# Patient Record
Sex: Male | Born: 1965 | Race: Black or African American | Hispanic: No | Marital: Married | Smoking: Current every day smoker
Health system: Southern US, Community
[De-identification: ages and names within clinical notes are randomized; demographics above are authoritative.]

---

## 2006-02-25 ENCOUNTER — Emergency Department (HOSPITAL_COMMUNITY): Admission: EM | Admit: 2006-02-25 | Discharge: 2006-02-26 | Payer: Self-pay | Admitting: Emergency Medicine

## 2007-01-16 IMAGING — CR DG SHOULDER 2+V*L*
3 series · 3 of 3 positions shown · non-contrast
Comparison: none

CLINICAL DATA: 42 year-old, left shoulder pain. Patient fell.
 LEFT HUMERUS ? 2 VIEW:

[w shoulder ap internal left]
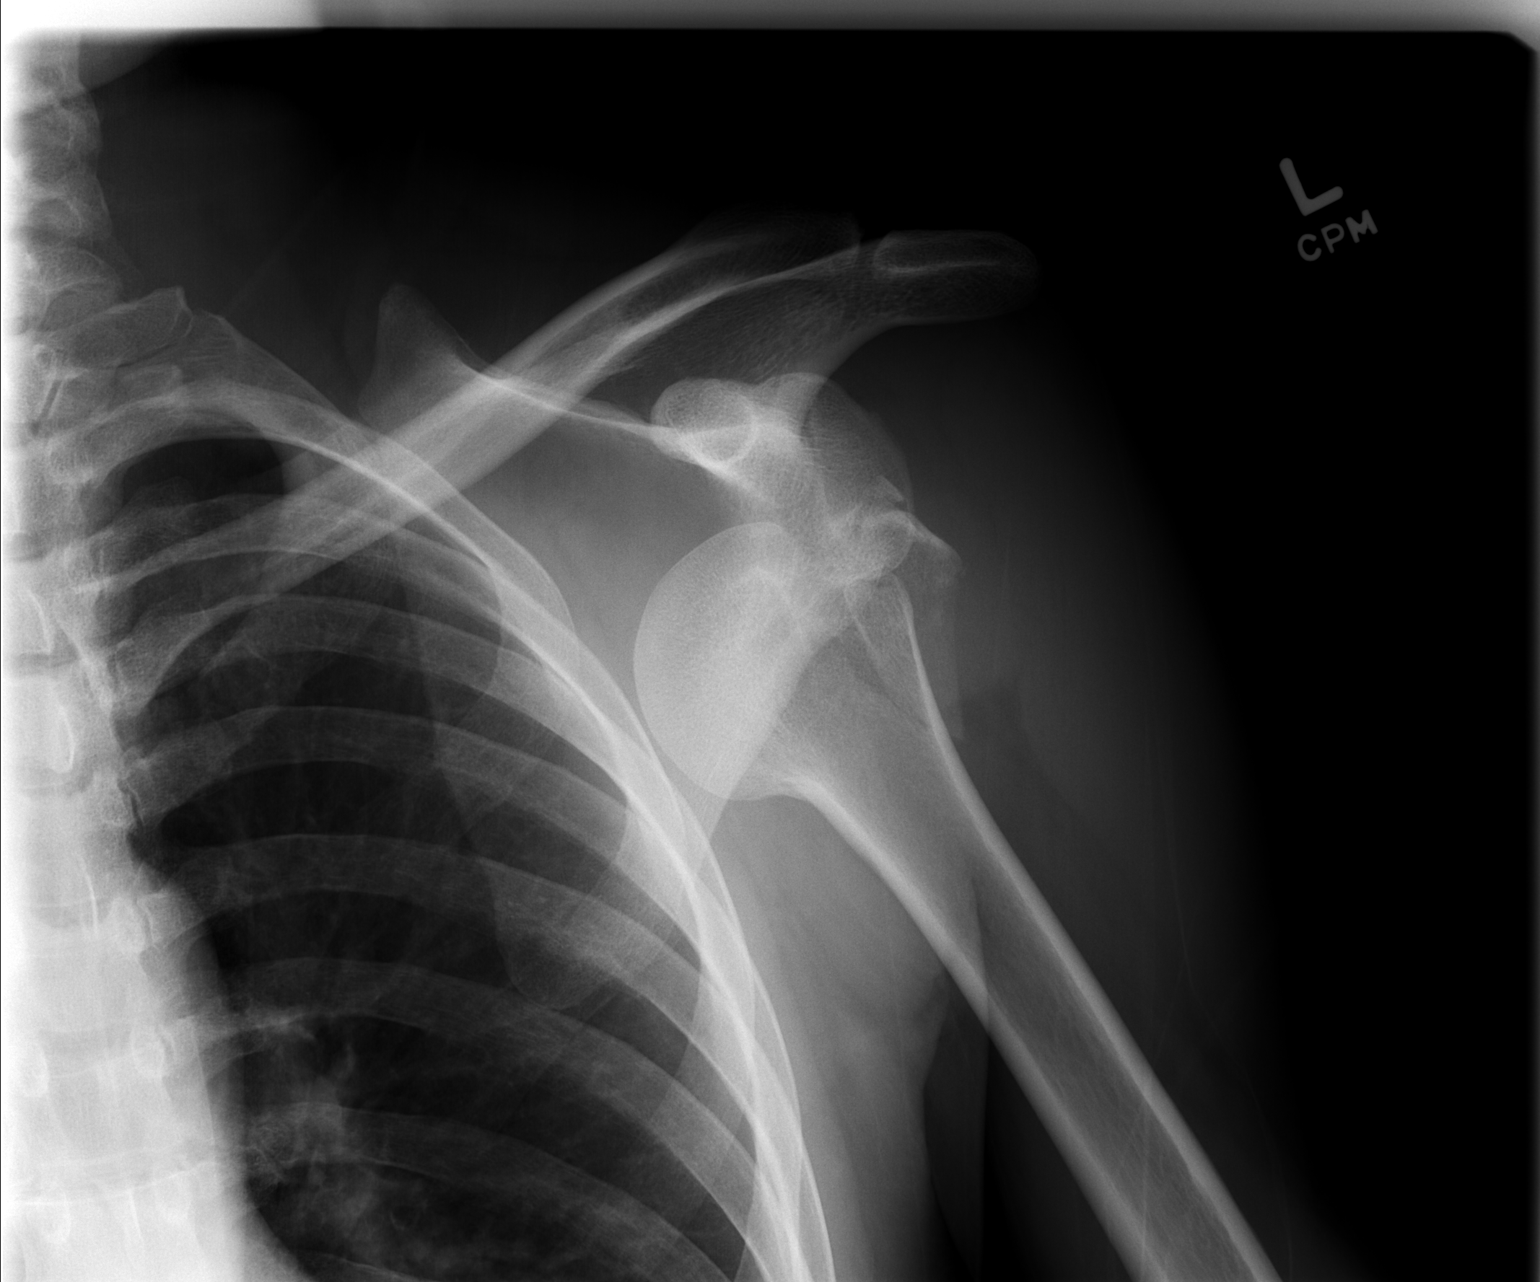

[w shoulder ap external left]
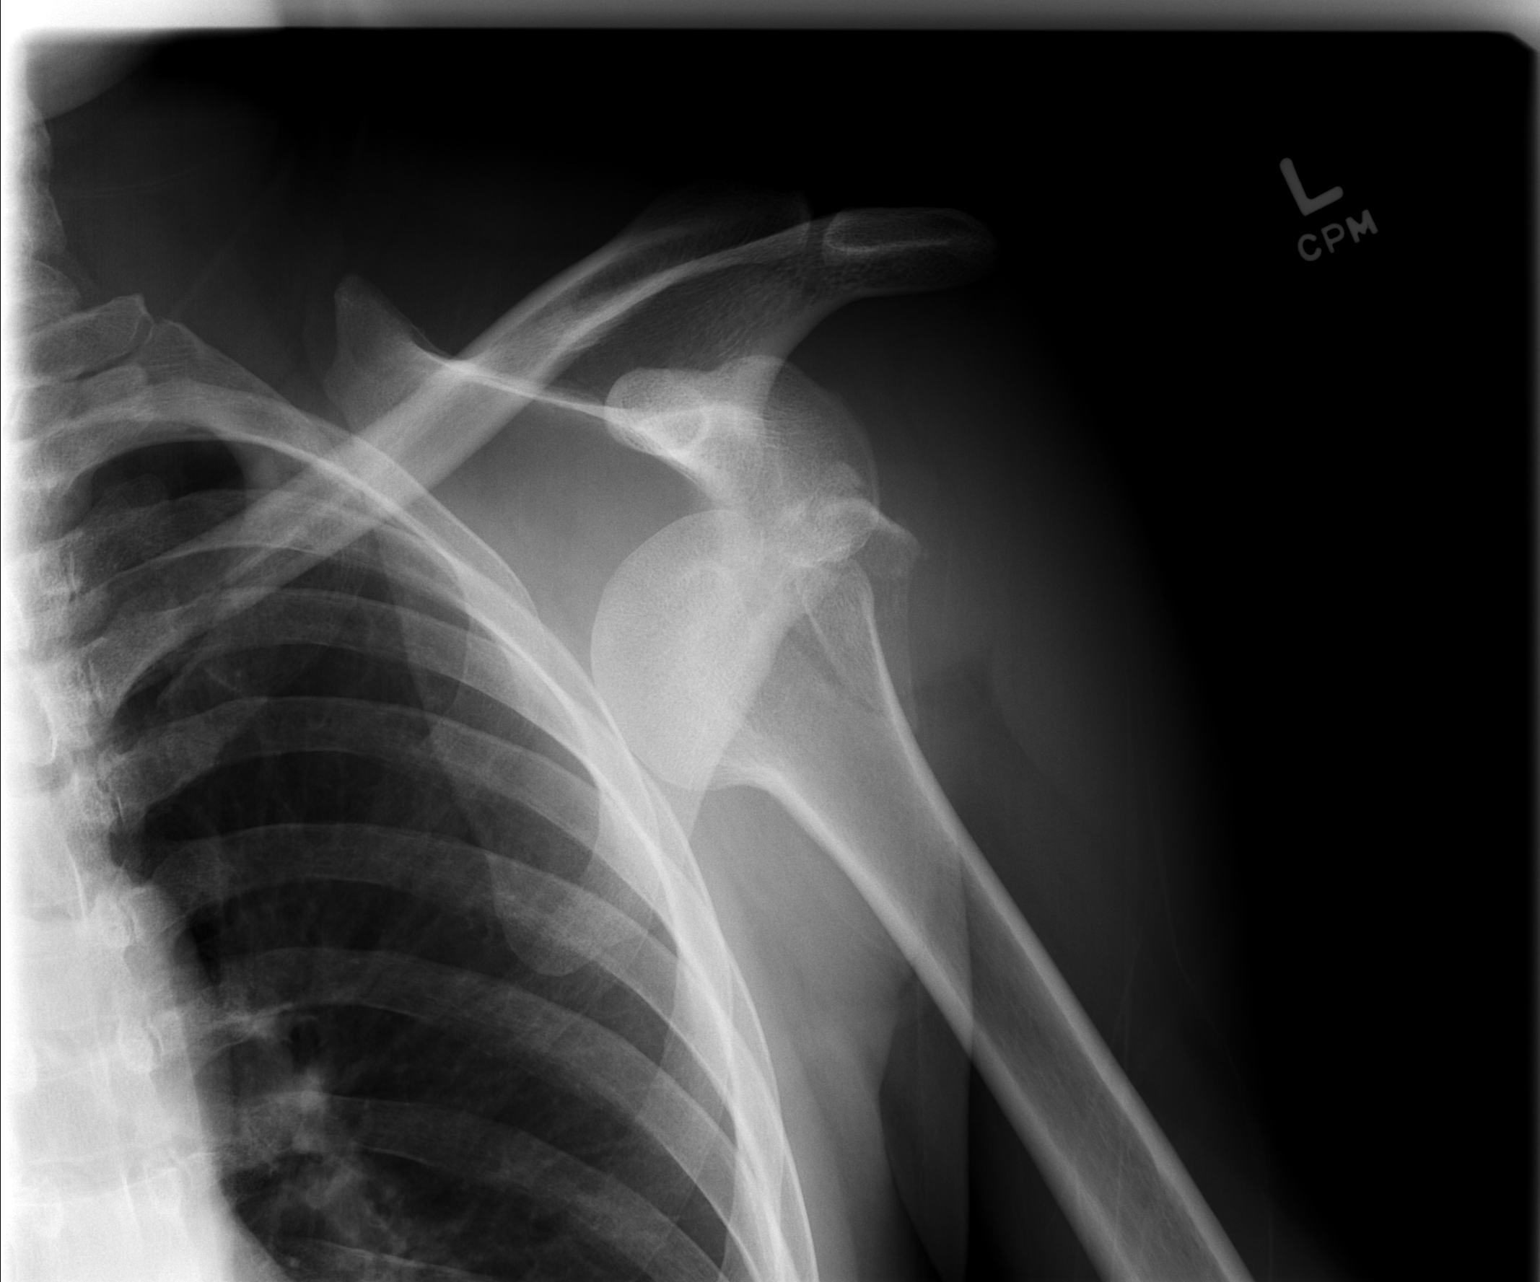

[w shoulder y view left]
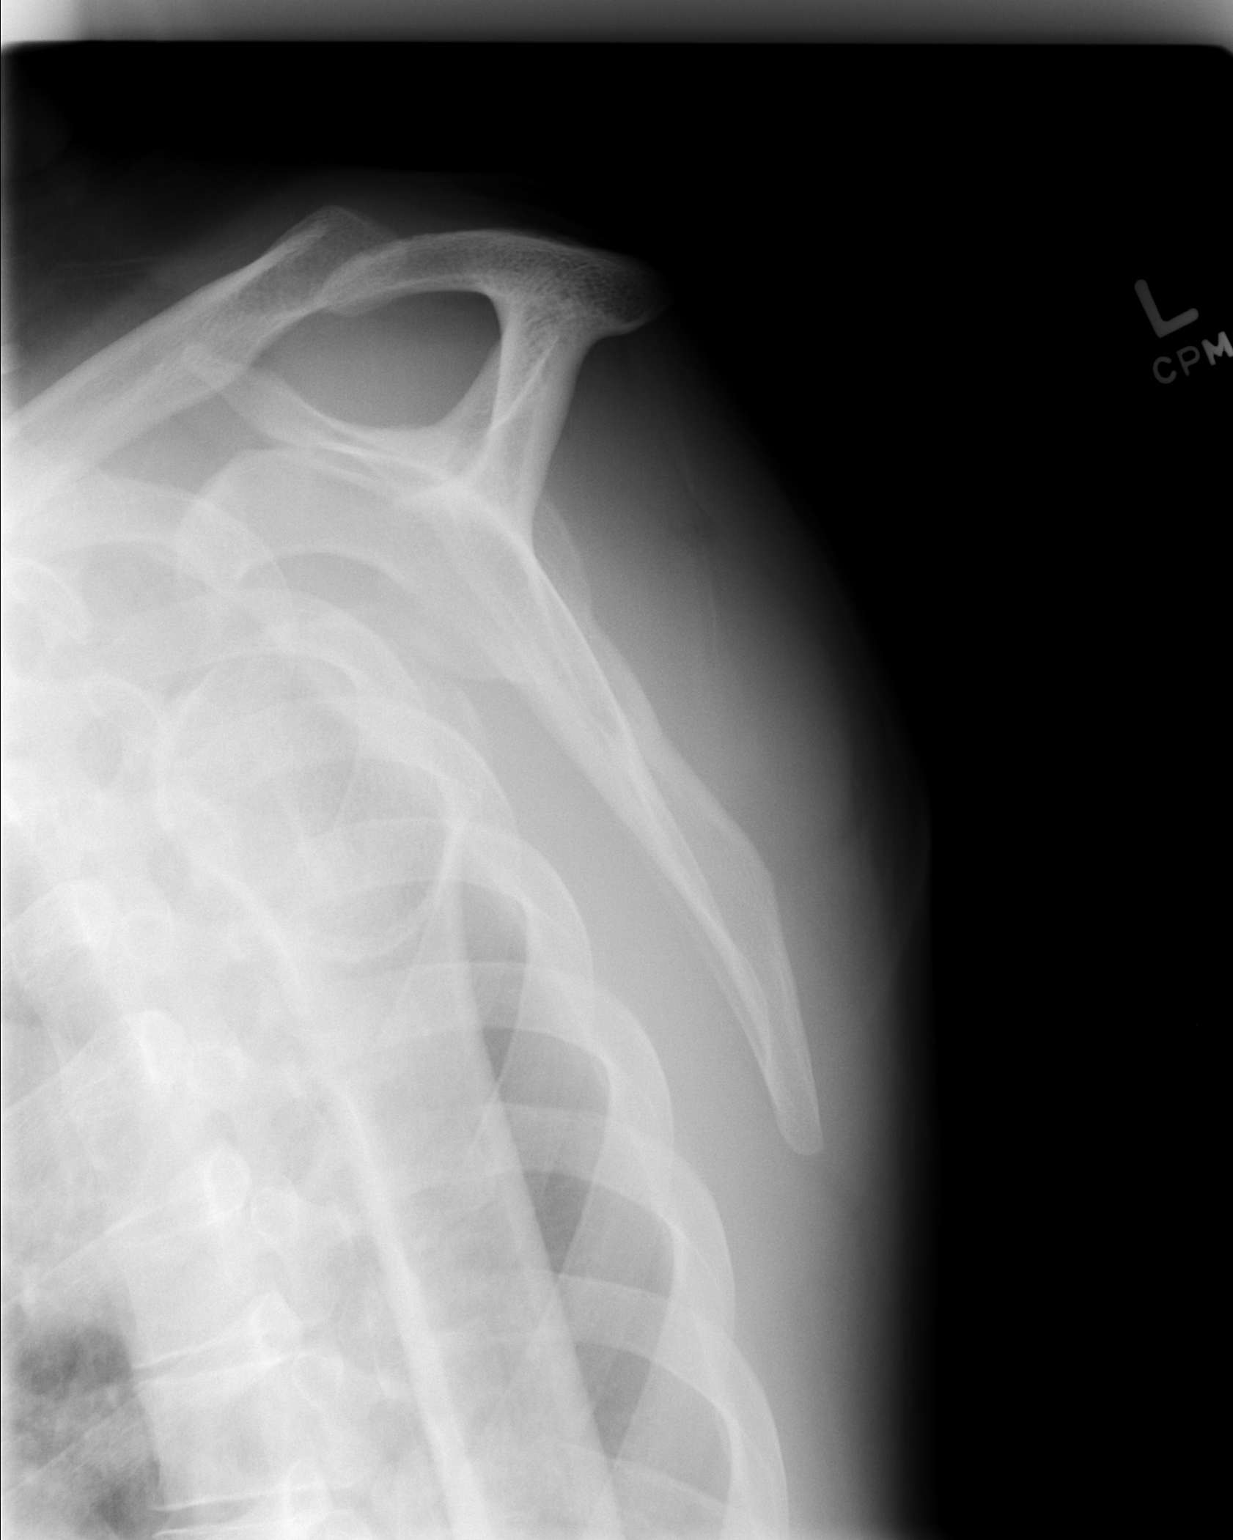

[3 of 3 positions shown; findings below may reference images not displayed]

FINDINGS: There is a fracture dislocation involving the humeral head.  No humeral shaft fractures are seen.
IMPRESSION: 1.  Fracture dislocation of the humeral head.
 2.  No humeral shaft fractures.
 LEFT SHOULDER ? 3 VIEW:
FINDINGS: Anterior dislocation of the humeral head.  There is a displaced fracture of the greater tuberosity.  No definite glenoid fracture is seen. AC joint is intact.  Left apex is clear.  Clavicle is intact.
IMPRESSION: Anterior dislocation with displaced greater tuberosity fracture.

## 2010-05-26 ENCOUNTER — Emergency Department (HOSPITAL_COMMUNITY): Admission: EM | Admit: 2010-05-26 | Discharge: 2010-05-26 | Payer: Self-pay | Admitting: Emergency Medicine

## 2012-05-05 ENCOUNTER — Encounter (HOSPITAL_COMMUNITY): Payer: Self-pay

## 2012-05-05 ENCOUNTER — Emergency Department (HOSPITAL_COMMUNITY)
Admission: EM | Admit: 2012-05-05 | Discharge: 2012-05-05 | Disposition: A | Discharge status: Home / Self Care | Payer: Self-pay | Attending: Emergency Medicine | Admitting: Emergency Medicine

## 2012-05-05 DIAGNOSIS — S91309A Unspecified open wound, unspecified foot, initial encounter: Secondary | ICD-10-CM | POA: Insufficient documentation

## 2012-05-05 DIAGNOSIS — W268XXA Contact with other sharp object(s), not elsewhere classified, initial encounter: Secondary | ICD-10-CM | POA: Insufficient documentation

## 2012-05-05 DIAGNOSIS — S91339A Puncture wound without foreign body, unspecified foot, initial encounter: Secondary | ICD-10-CM

## 2012-05-05 DIAGNOSIS — Y9301 Activity, walking, marching and hiking: Secondary | ICD-10-CM | POA: Insufficient documentation

## 2012-05-05 DIAGNOSIS — F17200 Nicotine dependence, unspecified, uncomplicated: Secondary | ICD-10-CM | POA: Insufficient documentation

## 2012-05-05 DIAGNOSIS — Y998 Other external cause status: Secondary | ICD-10-CM | POA: Insufficient documentation

## 2012-05-05 MED ORDER — CIPROFLOXACIN HCL 750 MG PO TABS: 750.0000 mg | ORAL_TABLET | Freq: Two times a day (BID) | ORAL | Status: AC

## 2012-05-05 MED ORDER — TETANUS-DIPHTH-ACELL PERTUSSIS 5-2.5-18.5 LF-MCG/0.5 IM SUSP
0.5000 mL | Freq: Once | INTRAMUSCULAR | Status: AC
  Administered 2012-05-05: 0.5 mL via INTRAMUSCULAR
  Filled 2012-05-05: qty 0.5

## 2012-05-05 NOTE — ED Provider Notes (Signed)
History     CSN: [id number]  Arrival date & time 05/05/12  0807   First MD Initiated Contact with Patient 05/05/12 0813      Chief Complaint  Patient presents with  . Puncture Wound    (Consider location/radiation/quality/duration/timing/severity/associated sxs/prior treatment) HPI Pt stepped on a piece of wire 2 days ago.  Punctured through his tennis shoe sole and into sole of left foot.  Pt has cleaned the area with hydrogen peroxide.  No fever, no pain, no redness or drainage around wound.  Pt does not know when his last tetanus shot was.  Pt states the piece of wire was thick and did not break off when he stepped on it.  There are no other associated systemic symptoms, there are no alleviating or modifying factors.   History reviewed. No pertinent past medical history.  History reviewed. No pertinent past surgical history.  No family history on file.  History  Substance Use Topics  . Smoking status: Current Everyday Smoker  . Smokeless tobacco: Not on file  . Alcohol Use: Yes      Review of Systems ROS reviewed and all otherwise negative except for mentioned in HPI  Allergies  Review of patient's allergies indicates no known allergies.  Home Medications   Current Outpatient Rx  Name Route Sig Dispense Refill  . CIPROFLOXACIN HCL 750 MG PO TABS Oral Take 1 tablet (750 mg total) by mouth 2 (two) times daily. 14 tablet 0    BP 146/81  Pulse 73  Temp 97.8 F (36.6 C) (Oral)  Resp 16  Ht 5\' 9"  (1.753 m)  Wt 145 lb (65.772 kg)  BMI 21.41 kg/m2  SpO2 100% Vitals reviewed Physical Exam Physical Examination: General appearance - alert, well appearing, and in no distress Mental status - alert, oriented to person, place, and time Chest - clear to auscultation, no wheezes, rales or rhonchi, symmetric air entry Heart - normal rate, regular rhythm, normal S1, S2, no murmurs, rubs, clicks or gallops Neurological - alert, oriented, normal speech, no focal findings or  movement disorder noted Musculoskeletal - no joint tenderness, deformity or swelling Extremities - peripheral pulses normal, no pedal edema, no clubbing or cyanosis Skin - normal coloration and turgor, no rashes,very small punctate region of hyperpigmented skin on plantar surface of left foot- no tenderness, no erythema surrounding  ED Course  Procedures (including critical care time)  Labs Reviewed - No data to display No results found.   1. Puncture wound of foot       MDM  Pt presents with c/o puncture wound through sole of shoe.  Tetanus updated, pt also started on cipro for possible pseudomonas coverage.  Pt discharged with strict return precautions, he is agreeable with this plan.        Ethelda Chick, MD 05/08/12 [phone]

## 2012-05-05 NOTE — Discharge Instructions (Signed)
Return to the ED with any concerns including fever, drainage from wounds, redness around wound, or any other alarming symptoms

## 2012-05-05 NOTE — ED Notes (Signed)
Pt. Stepped on a wire 2 days ago and it punctured the bottom of his rt. Foot.  No swelling or redness noted. No drainage noted.

## 2012-10-04 ENCOUNTER — Emergency Department (HOSPITAL_COMMUNITY)
Admission: EM | Admit: 2012-10-04 | Discharge: 2012-10-04 | Disposition: A | Discharge status: Home / Self Care | Payer: Self-pay | Attending: Emergency Medicine | Admitting: Emergency Medicine

## 2012-10-04 ENCOUNTER — Encounter (HOSPITAL_COMMUNITY): Payer: Self-pay | Admitting: Emergency Medicine

## 2012-10-04 DIAGNOSIS — T5894XA Toxic effect of carbon monoxide from unspecified source, undetermined, initial encounter: Secondary | ICD-10-CM | POA: Insufficient documentation

## 2012-10-04 DIAGNOSIS — R51 Headache: Secondary | ICD-10-CM | POA: Insufficient documentation

## 2012-10-04 DIAGNOSIS — F17200 Nicotine dependence, unspecified, uncomplicated: Secondary | ICD-10-CM | POA: Insufficient documentation

## 2012-10-04 DIAGNOSIS — T5891XA Toxic effect of carbon monoxide from unspecified source, accidental (unintentional), initial encounter: Secondary | ICD-10-CM

## 2012-10-04 DIAGNOSIS — R110 Nausea: Secondary | ICD-10-CM | POA: Insufficient documentation

## 2012-10-04 LAB — POCT I-STAT 3, ART BLOOD GAS (G3+)
Acid-Base Excess: 3 mmol/L — ABNORMAL HIGH (ref 0.0–2.0)
Bicarbonate: 28.3 mEq/L — ABNORMAL HIGH (ref 20.0–24.0)
O2 Saturation: 100 %
TCO2: 30 mmol/L (ref 0–100)

## 2012-10-04 LAB — CARBOXYHEMOGLOBIN
Carboxyhemoglobin: 7.1 % (ref 0.5–1.5)
Methemoglobin: 1 % (ref 0.0–1.5)

## 2012-10-04 LAB — POCT I-STAT, CHEM 8
BUN: 11 mg/dL (ref 6–23)
Calcium, Ion: 1.17 mmol/L (ref 1.12–1.23)
Chloride: 102 mEq/L (ref 96–112)
Creatinine, Ser: 0.9 mg/dL (ref 0.50–1.35)
Potassium: 3.8 mEq/L (ref 3.5–5.1)

## 2012-10-04 NOTE — ED Provider Notes (Addendum)
8:39 AM Assumed care from Dr Rhunette Croft in sign out. Pt assessed. Remains on NRB. Says he feels fine. Understands plan to continue until about 1000 and reassess. No new questions. Apparently FD to assess home.  Raeford Razor, MD 10/04/12 0841  10:11 AM Pt reassessed. No complaints. Reports that home was checked out and per him everything ok. Return precautions discussed. Outpt FU.  Raeford Razor, MD 10/04/12 1012

## 2012-10-04 NOTE — ED Notes (Signed)
Pt. Presents to the ED with dizziness with no nausea/vomiting at this present time.  Pt. States he was eating this evening and suddenly felt nauseated and dizzy.  Denies pain and nausea at this time.  CMS intact.  Pt. Has consumed ETOH this evening around 10:30 pm stated by patient.  Pt. Appear stable at this time.

## 2012-10-04 NOTE — ED Notes (Signed)
Pt remains on NRB until 10am per poison control and EDP recommendations

## 2012-10-04 NOTE — ED Notes (Signed)
Pt c/o dizziness this evening around 2 am after drinking a cup of coffee.  States he normally drinks alcohol and smokes daily.  Has been using Kerosene heater the past 2 weeks.  No pain, diaphoresis, SOB.  C/o nausea.

## 2012-10-04 NOTE — ED Notes (Signed)
[employer] informed of mother still in home. Pt unable to get hold of mother or sister. [employer] will visit home for welfare check.

## 2012-10-04 NOTE — ED Provider Notes (Signed)
History     CSN: [id number]  Arrival date & time 10/04/12  0423   First MD Initiated Contact with Patient 10/04/12 0434      Chief Complaint  Patient presents with  . Dizziness   HPI  History provided by the patient. Patient is a 46 year old male with no significant PMH who presents with complaints of lightheadedness, near syncope and nausea symptoms. Patient states he is at home tonight and early this morning relaxing when he suddenly began to feel lightheaded and dizzy with nausea symptoms. Patient does report having a 40 ounce beer earlier in the evening but this is typical for him and he is a daily drinker of about the same amount. Patient does also report having some marijuana in the evening but this also is normal for him. Patient was having some cups of coffee around 2 AM this morning prior to the onset of his symptoms. Symptoms came on gradually and were persistent. Patient reports having very slight headache but no significant headache pain or discomfort. He tried to go outside for fresh air and sat on his patio but only had slight improvements. Patient states he felt very unusual and was concerned for something else causing his symptoms. Patient denies having any episode of vomiting. He denies having any fever, chills or sweats. He has been well otherwise recently. No nasal congestion, rhinorrhea or sore throat. No abdominal pain or diarrhea or constipation symptoms. Patient has not traveled recently. He has no unusual foods. Patient does report having increased use of indoor kerosene heating system for his house due to the cold weather. Patient was using this this evening.     History reviewed. No pertinent past medical history.  History reviewed. No pertinent past surgical history.  History reviewed. No pertinent family history.  History  Substance Use Topics  . Smoking status: Current Every Day Smoker -- 1.0 packs/day for 10 years    Types: Cigarettes  . Smokeless tobacco:  Not on file  . Alcohol Use: Yes      Review of Systems  Constitutional: Negative for fever, chills and diaphoresis.  HENT: Negative for congestion, sore throat and rhinorrhea.   Respiratory: Negative for cough, shortness of breath and wheezing.   Cardiovascular: Negative for chest pain and palpitations.  Gastrointestinal: Positive for nausea. Negative for vomiting, abdominal pain, diarrhea and constipation.  Skin: Negative for rash.  Neurological: Positive for dizziness, light-headedness and headaches.  Psychiatric/Behavioral: Negative for confusion.  All other systems reviewed and are negative.    Allergies  Review of patient's allergies indicates no known allergies.  Home Medications  No current outpatient prescriptions on file.  BP 151/97  Pulse 65  Temp 97.7 F (36.5 C)  Resp 20  SpO2 97%  Physical Exam  Nursing note and vitals reviewed. Constitutional: He is oriented to person, place, and time. He appears well-developed and well-nourished. No distress.  HENT:  Head: Normocephalic.  Mouth/Throat: Oropharynx is clear and moist.  Neck: Normal range of motion. Neck supple.  Cardiovascular: Normal rate and regular rhythm.   No murmur heard. Pulmonary/Chest: Effort normal and breath sounds normal. No stridor. No respiratory distress. He has no wheezes. He has no rales.  Abdominal: Soft. There is no tenderness. There is no rebound and no guarding.  Neurological: He is alert and oriented to person, place, and time.  Skin: Skin is warm. No rash noted. He is not diaphoretic.  Psychiatric: He has a normal mood and affect. His behavior is normal.  ED Course  Procedures   Results for orders placed during the hospital encounter of 10/04/12  CARBOXYHEMOGLOBIN      Component Value Range   Total hemoglobin 16.1  13.5 - 18.0 g/dL   O2 Saturation 47.8     Carboxyhemoglobin 7.1 (*) 0.5 - 1.5 %   Methemoglobin 1.0  0.0 - 1.5 %  POCT I-STAT, CHEM 8      Component Value  Range   Sodium 140  135 - 145 mEq/L   Potassium 3.8  3.5 - 5.1 mEq/L   Chloride 102  96 - 112 mEq/L   BUN 11  6 - 23 mg/dL   Creatinine, Ser 2.95  0.50 - 1.35 mg/dL   Glucose, Bld 97  70 - 99 mg/dL   Calcium, Ion 6.21  3.08 - 1.23 mmol/L   TCO2 28  0 - 100 mmol/L   Hemoglobin 16.7  13.0 - 17.0 g/dL   HCT 65.7  84.6 - 96.2 %        1. Carbon monoxide poisoning       MDM  4:35AM patient seen and evaluated. Patient appears well in no acute distress. He has normal respirations at this time with normal O2 sats.  Pt with eleveated Carboxyhemoglobin with concerns for CO poisoning.  Pt started on non-rebreather.   Pt discussed Attending Physician. He will continue to follow pt and ABG results.    Pt does report turning stove off before leaving.  His mother is still at home.  He did try calling mother with no answer.  We will continue to try and reach her.        Angus Seller, [place] 10/04/12 [phone]

## 2012-10-04 NOTE — ED Notes (Signed)
Pt placed on non-rebreather per EDP Nanavati order

## 2012-10-07 NOTE — ED Provider Notes (Signed)
Medical screening examination/treatment/procedure(s) were performed by non-physician practitioner and as supervising physician I was immediately available for consultation/collaboration.  Derwood Kaplan, MD 10/07/12 0104

## 2015-09-14 ENCOUNTER — Encounter (HOSPITAL_COMMUNITY): Payer: Self-pay

## 2015-09-14 ENCOUNTER — Emergency Department (HOSPITAL_COMMUNITY)
Admission: EM | Admit: 2015-09-14 | Discharge: 2015-09-14 | Disposition: A | Discharge status: Home / Self Care | Payer: No Typology Code available for payment source | Attending: Emergency Medicine | Admitting: Emergency Medicine

## 2015-09-14 DIAGNOSIS — S299XXA Unspecified injury of thorax, initial encounter: Secondary | ICD-10-CM | POA: Diagnosis not present

## 2015-09-14 DIAGNOSIS — S199XXA Unspecified injury of neck, initial encounter: Secondary | ICD-10-CM | POA: Diagnosis not present

## 2015-09-14 DIAGNOSIS — F17210 Nicotine dependence, cigarettes, uncomplicated: Secondary | ICD-10-CM | POA: Insufficient documentation

## 2015-09-14 DIAGNOSIS — Y9389 Activity, other specified: Secondary | ICD-10-CM | POA: Diagnosis not present

## 2015-09-14 DIAGNOSIS — Y92410 Unspecified street and highway as the place of occurrence of the external cause: Secondary | ICD-10-CM | POA: Diagnosis not present

## 2015-09-14 DIAGNOSIS — Y998 Other external cause status: Secondary | ICD-10-CM | POA: Insufficient documentation

## 2015-09-14 DIAGNOSIS — S4992XA Unspecified injury of left shoulder and upper arm, initial encounter: Secondary | ICD-10-CM | POA: Diagnosis present

## 2015-09-14 DIAGNOSIS — M25512 Pain in left shoulder: Secondary | ICD-10-CM

## 2015-09-14 MED ORDER — METHOCARBAMOL 500 MG PO TABS: 500.0000 mg | ORAL_TABLET | Freq: Two times a day (BID) | ORAL | Status: DC | PRN

## 2015-09-14 MED ORDER — IBUPROFEN 800 MG PO TABS
800.0000 mg | ORAL_TABLET | Freq: Once | ORAL | Status: AC
  Administered 2015-09-14: 800 mg via ORAL
  Filled 2015-09-14: qty 1

## 2015-09-14 MED ORDER — IBUPROFEN 800 MG PO TABS: 800.0000 mg | ORAL_TABLET | Freq: Three times a day (TID) | ORAL | Status: DC

## 2015-09-14 MED ORDER — TRAMADOL HCL 50 MG PO TABS: 50.0000 mg | ORAL_TABLET | Freq: Four times a day (QID) | ORAL | Status: DC | PRN
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
## 2023-10-02 ENCOUNTER — Inpatient Hospital Stay (HOSPITAL_COMMUNITY): Payer: Commercial Managed Care - HMO

## 2023-10-02 ENCOUNTER — Inpatient Hospital Stay (HOSPITAL_COMMUNITY): Payer: Self-pay

## 2023-10-02 ENCOUNTER — Other Ambulatory Visit: Payer: Self-pay

## 2023-10-02 ENCOUNTER — Emergency Department (HOSPITAL_COMMUNITY): Payer: Self-pay

## 2023-10-02 ENCOUNTER — Inpatient Hospital Stay (HOSPITAL_COMMUNITY)
Admission: EM | Admit: 2023-10-02 | Discharge: 2023-10-11 | DRG: 023 | Disposition: E | Payer: Commercial Managed Care - HMO | Attending: Pulmonary Disease | Admitting: Pulmonary Disease

## 2023-10-02 DIAGNOSIS — I610 Nontraumatic intracerebral hemorrhage in hemisphere, subcortical: Secondary | ICD-10-CM | POA: Diagnosis present

## 2023-10-02 DIAGNOSIS — I615 Nontraumatic intracerebral hemorrhage, intraventricular: Secondary | ICD-10-CM | POA: Diagnosis not present

## 2023-10-02 DIAGNOSIS — G936 Cerebral edema: Secondary | ICD-10-CM | POA: Diagnosis present

## 2023-10-02 DIAGNOSIS — S0093XA Contusion of unspecified part of head, initial encounter: Secondary | ICD-10-CM | POA: Diagnosis present

## 2023-10-02 DIAGNOSIS — I21A1 Myocardial infarction type 2: Secondary | ICD-10-CM | POA: Diagnosis present

## 2023-10-02 DIAGNOSIS — D72829 Elevated white blood cell count, unspecified: Secondary | ICD-10-CM | POA: Diagnosis present

## 2023-10-02 DIAGNOSIS — I129 Hypertensive chronic kidney disease with stage 1 through stage 4 chronic kidney disease, or unspecified chronic kidney disease: Secondary | ICD-10-CM | POA: Diagnosis present

## 2023-10-02 DIAGNOSIS — I6523 Occlusion and stenosis of bilateral carotid arteries: Secondary | ICD-10-CM | POA: Diagnosis present

## 2023-10-02 DIAGNOSIS — G9340 Encephalopathy, unspecified: Secondary | ICD-10-CM | POA: Diagnosis present

## 2023-10-02 DIAGNOSIS — R29732 NIHSS score 32: Secondary | ICD-10-CM | POA: Diagnosis present

## 2023-10-02 DIAGNOSIS — F17210 Nicotine dependence, cigarettes, uncomplicated: Secondary | ICD-10-CM | POA: Diagnosis present

## 2023-10-02 DIAGNOSIS — I609 Nontraumatic subarachnoid hemorrhage, unspecified: Secondary | ICD-10-CM | POA: Diagnosis present

## 2023-10-02 DIAGNOSIS — N1832 Chronic kidney disease, stage 3b: Secondary | ICD-10-CM | POA: Diagnosis present

## 2023-10-02 DIAGNOSIS — R1310 Dysphagia, unspecified: Secondary | ICD-10-CM | POA: Diagnosis present

## 2023-10-02 DIAGNOSIS — I161 Hypertensive emergency: Secondary | ICD-10-CM | POA: Diagnosis present

## 2023-10-02 DIAGNOSIS — G9382 Brain death: Secondary | ICD-10-CM | POA: Diagnosis not present

## 2023-10-02 DIAGNOSIS — E872 Acidosis, unspecified: Secondary | ICD-10-CM | POA: Diagnosis not present

## 2023-10-02 DIAGNOSIS — R402A Nontraumatic coma due to underlying condition: Secondary | ICD-10-CM | POA: Diagnosis present

## 2023-10-02 DIAGNOSIS — I6389 Other cerebral infarction: Secondary | ICD-10-CM | POA: Diagnosis not present

## 2023-10-02 DIAGNOSIS — G935 Compression of brain: Secondary | ICD-10-CM | POA: Diagnosis present

## 2023-10-02 DIAGNOSIS — R4182 Altered mental status, unspecified: Secondary | ICD-10-CM | POA: Diagnosis present

## 2023-10-02 DIAGNOSIS — Z66 Do not resuscitate: Secondary | ICD-10-CM | POA: Diagnosis not present

## 2023-10-02 DIAGNOSIS — M549 Dorsalgia, unspecified: Secondary | ICD-10-CM | POA: Diagnosis present

## 2023-10-02 DIAGNOSIS — G911 Obstructive hydrocephalus: Secondary | ICD-10-CM | POA: Diagnosis present

## 2023-10-02 DIAGNOSIS — R7989 Other specified abnormal findings of blood chemistry: Secondary | ICD-10-CM | POA: Diagnosis present

## 2023-10-02 DIAGNOSIS — I619 Nontraumatic intracerebral hemorrhage, unspecified: Principal | ICD-10-CM | POA: Diagnosis present

## 2023-10-02 DIAGNOSIS — R569 Unspecified convulsions: Secondary | ICD-10-CM | POA: Diagnosis present

## 2023-10-02 DIAGNOSIS — Z1152 Encounter for screening for COVID-19: Secondary | ICD-10-CM | POA: Diagnosis not present

## 2023-10-02 DIAGNOSIS — G909 Disorder of the autonomic nervous system, unspecified: Secondary | ICD-10-CM | POA: Diagnosis not present

## 2023-10-02 DIAGNOSIS — N179 Acute kidney failure, unspecified: Secondary | ICD-10-CM | POA: Diagnosis not present

## 2023-10-02 DIAGNOSIS — Z515 Encounter for palliative care: Secondary | ICD-10-CM

## 2023-10-02 DIAGNOSIS — J9601 Acute respiratory failure with hypoxia: Secondary | ICD-10-CM | POA: Diagnosis present

## 2023-10-02 DIAGNOSIS — E870 Hyperosmolality and hypernatremia: Secondary | ICD-10-CM | POA: Diagnosis present

## 2023-10-02 LAB — I-STAT ARTERIAL BLOOD GAS, ED
Acid-base deficit: 8 mmol/L — ABNORMAL HIGH (ref 0.0–2.0)
Bicarbonate: 17.9 mmol/L — ABNORMAL LOW (ref 20.0–28.0)
Calcium, Ion: 1.02 mmol/L — ABNORMAL LOW (ref 1.15–1.40)
HCT: 38 % — ABNORMAL LOW (ref 39.0–52.0)
Hemoglobin: 12.9 g/dL — ABNORMAL LOW (ref 13.0–17.0)
O2 Saturation: 100 %
Patient temperature: 98.8
Potassium: 3.1 mmol/L — ABNORMAL LOW (ref 3.5–5.1)
Sodium: 129 mmol/L — ABNORMAL LOW (ref 135–145)
TCO2: 19 mmol/L — ABNORMAL LOW (ref 22–32)
pCO2 arterial: 35.5 mm[Hg] (ref 32–48)
pH, Arterial: 7.311 — ABNORMAL LOW (ref 7.35–7.45)
pO2, Arterial: 503 mm[Hg] — ABNORMAL HIGH (ref 83–108)

## 2023-10-02 LAB — TROPONIN I (HIGH SENSITIVITY)
Troponin I (High Sensitivity): 82 ng/L — ABNORMAL HIGH (ref <18)
Troponin I (High Sensitivity): 2221 ng/L (ref <18)

## 2023-10-02 LAB — URINALYSIS, ROUTINE W REFLEX MICROSCOPIC
Bacteria, UA: NONE SEEN
Bilirubin Urine: NEGATIVE
Glucose, UA: 150 mg/dL — AB
Ketones, ur: NEGATIVE mg/dL
Leukocytes,Ua: NEGATIVE
Nitrite: NEGATIVE
Protein, ur: >=300 mg/dL — AB
Specific Gravity, Urine: 1.028 (ref 1.005–1.030)
pH: 6 (ref 5.0–8.0)

## 2023-10-02 LAB — CBC WITH DIFFERENTIAL/PLATELET
Abs Immature Granulocytes: 0.4 10*3/uL — ABNORMAL HIGH (ref 0.00–0.07)
Basophils Absolute: 0 10*3/uL (ref 0.0–0.1)
Basophils Relative: 0 %
Eosinophils Absolute: 0.2 10*3/uL (ref 0.0–0.5)
Eosinophils Relative: 1 %
HCT: 57 % — ABNORMAL HIGH (ref 39.0–52.0)
Hemoglobin: 18.9 g/dL — ABNORMAL HIGH (ref 13.0–17.0)
Lymphocytes Relative: 32 %
Lymphs Abs: 6.4 10*3/uL — ABNORMAL HIGH (ref 0.7–4.0)
MCH: 30.9 pg (ref 26.0–34.0)
MCHC: 33.2 g/dL (ref 30.0–36.0)
MCV: 93.1 fL (ref 80.0–100.0)
Metamyelocytes Relative: 1 %
Monocytes Absolute: 1.2 10*3/uL — ABNORMAL HIGH (ref 0.1–1.0)
Monocytes Relative: 6 %
Myelocytes: 1 %
Neutro Abs: 11.9 10*3/uL — ABNORMAL HIGH (ref 1.7–7.7)
Neutrophils Relative %: 59 %
Platelets: 165 10*3/uL (ref 150–400)
RBC: 6.12 MIL/uL — ABNORMAL HIGH (ref 4.22–5.81)
RDW: 13 % (ref 11.5–15.5)
WBC: 20.1 10*3/uL — ABNORMAL HIGH (ref 4.0–10.5)
nRBC: 0 % (ref 0.0–0.2)
nRBC: 1 /100{WBCs} — ABNORMAL HIGH

## 2023-10-02 LAB — RAPID URINE DRUG SCREEN, HOSP PERFORMED
Amphetamines: NOT DETECTED
Barbiturates: NOT DETECTED
Benzodiazepines: NOT DETECTED
Cocaine: NOT DETECTED
Opiates: NOT DETECTED
Tetrahydrocannabinol: POSITIVE — AB

## 2023-10-02 LAB — I-STAT CHEM 8, ED
BUN: 17 mg/dL (ref 6–20)
Calcium, Ion: 0.99 mmol/L — ABNORMAL LOW (ref 1.15–1.40)
Chloride: 106 mmol/L (ref 98–111)
Creatinine, Ser: 1.4 mg/dL — ABNORMAL HIGH (ref 0.61–1.24)
Glucose, Bld: 176 mg/dL — ABNORMAL HIGH (ref 70–99)
HCT: 58 % — ABNORMAL HIGH (ref 39.0–52.0)
Hemoglobin: 19.7 g/dL — ABNORMAL HIGH (ref 13.0–17.0)
Potassium: 3.6 mmol/L (ref 3.5–5.1)
Sodium: 140 mmol/L (ref 135–145)
TCO2: 17 mmol/L — ABNORMAL LOW (ref 22–32)

## 2023-10-02 LAB — I-STAT VENOUS BLOOD GAS, ED
Acid-base deficit: 11 mmol/L — ABNORMAL HIGH (ref 0.0–2.0)
Bicarbonate: 16 mmol/L — ABNORMAL LOW (ref 20.0–28.0)
Calcium, Ion: 1 mmol/L — ABNORMAL LOW (ref 1.15–1.40)
HCT: 58 % — ABNORMAL HIGH (ref 39.0–52.0)
Hemoglobin: 19.7 g/dL — ABNORMAL HIGH (ref 13.0–17.0)
O2 Saturation: 99 %
Potassium: 3.7 mmol/L (ref 3.5–5.1)
Sodium: 140 mmol/L (ref 135–145)
TCO2: 17 mmol/L — ABNORMAL LOW (ref 22–32)
pCO2, Ven: 39.4 mm[Hg] — ABNORMAL LOW (ref 44–60)
pH, Ven: 7.217 — ABNORMAL LOW (ref 7.25–7.43)
pO2, Ven: 153 mm[Hg] — ABNORMAL HIGH (ref 32–45)

## 2023-10-02 LAB — COMPREHENSIVE METABOLIC PANEL
ALT: 76 U/L — ABNORMAL HIGH (ref 0–44)
AST: 104 U/L — ABNORMAL HIGH (ref 15–41)
Albumin: 4.1 g/dL (ref 3.5–5.0)
Alkaline Phosphatase: 113 U/L (ref 38–126)
Anion gap: 28 — ABNORMAL HIGH (ref 5–15)
BUN: 15 mg/dL (ref 6–20)
CO2: 13 mmol/L — ABNORMAL LOW (ref 22–32)
Calcium: 9.9 mg/dL (ref 8.9–10.3)
Chloride: 100 mmol/L (ref 98–111)
Creatinine, Ser: 1.81 mg/dL — ABNORMAL HIGH (ref 0.61–1.24)
GFR, Estimated: 43 mL/min — ABNORMAL LOW (ref >60)
Glucose, Bld: 178 mg/dL — ABNORMAL HIGH (ref 70–99)
Potassium: 3.7 mmol/L (ref 3.5–5.1)
Sodium: 141 mmol/L (ref 135–145)
Total Bilirubin: 1.1 mg/dL (ref <1.2)
Total Protein: 8.9 g/dL — ABNORMAL HIGH (ref 6.5–8.1)

## 2023-10-02 LAB — HEMOGLOBIN A1C
Hgb A1c MFr Bld: 5.7 % — ABNORMAL HIGH (ref 4.8–5.6)
Mean Plasma Glucose: 116.89 mg/dL

## 2023-10-02 LAB — CK: Total CK: 179 U/L (ref 49–397)

## 2023-10-02 LAB — SODIUM
Sodium: 139 mmol/L (ref 135–145)
Sodium: 143 mmol/L (ref 135–145)

## 2023-10-02 LAB — HIV ANTIBODY (ROUTINE TESTING W REFLEX): HIV Screen 4th Generation wRfx: NONREACTIVE

## 2023-10-02 LAB — GLUCOSE, CAPILLARY: Glucose-Capillary: 169 mg/dL — ABNORMAL HIGH (ref 70–99)

## 2023-10-02 MED ORDER — LABETALOL HCL 5 MG/ML IV SOLN
20.0000 mg | Freq: Once | INTRAVENOUS | Status: AC
  Administered 2023-10-02: 20 mg via INTRAVENOUS

## 2023-10-02 MED ORDER — IOHEXOL 300 MG/ML  SOLN
100.0000 mL | Freq: Once | INTRAMUSCULAR | Status: AC | PRN
  Administered 2023-10-02: 100 mL via INTRAVENOUS

## 2023-10-02 MED ORDER — ACETAMINOPHEN 325 MG PO TABS: 650.0000 mg | ORAL_TABLET | ORAL | Status: DC | PRN

## 2023-10-02 MED ORDER — PROPOFOL 1000 MG/100ML IV EMUL
0.0000 ug/kg/min | INTRAVENOUS | Status: DC
  Administered 2023-10-02 (×2): 60 ug/kg/min via INTRAVENOUS
  Administered 2023-10-02: 20 ug/kg/min via INTRAVENOUS
  Administered 2023-10-02 – 2023-10-03 (×2): 60 ug/kg/min via INTRAVENOUS
  Administered 2023-10-03 – 2023-10-04 (×2): 30 ug/kg/min via INTRAVENOUS
  Administered 2023-10-04: 40 ug/kg/min via INTRAVENOUS
  Filled 2023-10-02 (×2): qty 100
  Filled 2023-10-02: qty 200
  Filled 2023-10-02 (×7): qty 100

## 2023-10-02 MED ORDER — IOHEXOL 300 MG/ML  SOLN
75.0000 mL | Freq: Once | INTRAMUSCULAR | Status: AC | PRN
  Administered 2023-10-02: 75 mL via INTRAVENOUS

## 2023-10-02 MED ORDER — ACETAMINOPHEN 650 MG RE SUPP: 650.0000 mg | RECTAL | Status: DC | PRN

## 2023-10-02 MED ORDER — POLYETHYLENE GLYCOL 3350 17 G PO PACK: 17.0000 g | PACK | Freq: Every day | ORAL | Status: DC | PRN

## 2023-10-02 MED ORDER — SODIUM CHLORIDE 0.9 % IV SOLN: INTRAVENOUS | Status: DC

## 2023-10-02 MED ORDER — FENTANYL 2500MCG IN NS 250ML (10MCG/ML) PREMIX INFUSION
50.0000 ug/h | INTRAVENOUS | Status: DC
  Administered 2023-10-02: 125 ug/h via INTRAVENOUS
  Administered 2023-10-02: 150 ug/h via INTRAVENOUS
  Administered 2023-10-03: 60 ug/h via INTRAVENOUS
  Administered 2023-10-05: 50 ug/h via INTRAVENOUS
  Filled 2023-10-02 (×4): qty 250

## 2023-10-02 MED ORDER — CEFAZOLIN SODIUM-DEXTROSE 2-4 GM/100ML-% IV SOLN
2.0000 g | Freq: Three times a day (TID) | INTRAVENOUS | Status: AC
  Administered 2023-10-02 – 2023-10-03 (×3): 2 g via INTRAVENOUS
  Filled 2023-10-02 (×3): qty 100

## 2023-10-02 MED ORDER — NALOXONE HCL 2 MG/2ML IJ SOSY
PREFILLED_SYRINGE | INTRAMUSCULAR | Status: DC | PRN
  Administered 2023-10-02: 2 mg via INTRAVENOUS

## 2023-10-02 MED ORDER — CLEVIDIPINE BUTYRATE 0.5 MG/ML IV EMUL
INTRAVENOUS | Status: AC
  Administered 2023-10-02: 1 mg via INTRAVENOUS
  Filled 2023-10-02: qty 100

## 2023-10-02 MED ORDER — ETOMIDATE 2 MG/ML IV SOLN
INTRAVENOUS | Status: DC | PRN
  Administered 2023-10-02: 20 mg via INTRAVENOUS

## 2023-10-02 MED ORDER — STROKE: EARLY STAGES OF RECOVERY BOOK: Freq: Once | Status: DC

## 2023-10-02 MED ORDER — CLEVIDIPINE BUTYRATE 0.5 MG/ML IV EMUL
0.0000 mg/h | INTRAVENOUS | Status: DC
  Administered 2023-10-03: 21 mg/h via INTRAVENOUS
  Administered 2023-10-03: 32 mg/h via INTRAVENOUS
  Administered 2023-10-03: 15 mg/h via INTRAVENOUS
  Administered 2023-10-03: 24 mg/h via INTRAVENOUS
  Administered 2023-10-03 (×2): 16 mg/h via INTRAVENOUS
  Administered 2023-10-03: 2 mg/h via INTRAVENOUS
  Administered 2023-10-03: 32 mg/h via INTRAVENOUS
  Administered 2023-10-04: 12 mg/h via INTRAVENOUS
  Administered 2023-10-04: 6 mg/h via INTRAVENOUS
  Administered 2023-10-04: 10 mg/h via INTRAVENOUS
  Administered 2023-10-04: 8 mg/h via INTRAVENOUS
  Administered 2023-10-05: 20 mg/h via INTRAVENOUS
  Administered 2023-10-05: 14 mg/h via INTRAVENOUS
  Administered 2023-10-05: 32 mg/h via INTRAVENOUS
  Administered 2023-10-05: 20 mg/h via INTRAVENOUS
  Administered 2023-10-05 (×2): 14 mg/h via INTRAVENOUS
  Administered 2023-10-05 (×2): 32 mg/h via INTRAVENOUS
  Administered 2023-10-05: 10 mg/h via INTRAVENOUS
  Administered 2023-10-08 – 2023-10-09 (×3): 2 mg/h via INTRAVENOUS
  Filled 2023-10-02: qty 100
  Filled 2023-10-02: qty 50
  Filled 2023-10-02 (×5): qty 100
  Filled 2023-10-02: qty 50
  Filled 2023-10-02 (×5): qty 100
  Filled 2023-10-02: qty 50
  Filled 2023-10-02: qty 100
  Filled 2023-10-02: qty 50
  Filled 2023-10-02: qty 100
  Filled 2023-10-02: qty 50
  Filled 2023-10-02 (×2): qty 100
  Filled 2023-10-02: qty 50
  Filled 2023-10-02: qty 100
  Filled 2023-10-02: qty 50
  Filled 2023-10-02: qty 100

## 2023-10-02 MED ORDER — SUCCINYLCHOLINE CHLORIDE 20 MG/ML IJ SOLN
INTRAMUSCULAR | Status: DC | PRN
  Administered 2023-10-02: 140 mg via INTRAVENOUS

## 2023-10-02 MED ORDER — LEVETIRACETAM IN NACL 500 MG/100ML IV SOLN
500.0000 mg | Freq: Two times a day (BID) | INTRAVENOUS | Status: DC
  Administered 2023-10-02 (×2): 500 mg via INTRAVENOUS
  Filled 2023-10-02 (×2): qty 100

## 2023-10-02 MED ORDER — FENTANYL CITRATE PF 50 MCG/ML IJ SOSY: 50.0000 ug | PREFILLED_SYRINGE | Freq: Once | INTRAMUSCULAR | Status: DC

## 2023-10-02 MED ORDER — PANTOPRAZOLE SODIUM 40 MG IV SOLR
40.0000 mg | Freq: Every day | INTRAVENOUS | Status: DC
  Administered 2023-10-02 – 2023-10-08 (×7): 40 mg via INTRAVENOUS
  Filled 2023-10-02 (×7): qty 10

## 2023-10-02 MED ORDER — SODIUM CHLORIDE 3 % IV BOLUS
250.0000 mL | Freq: Once | INTRAVENOUS | Status: AC
  Administered 2023-10-02: 250 mL via INTRAVENOUS
  Filled 2023-10-02: qty 500

## 2023-10-02 MED ORDER — MANNITOL 20 % IV SOLN
1.0000 g/kg | Freq: Once | INTRAVENOUS | Status: AC
  Administered 2023-10-02: 75 g via INTRAVENOUS
  Filled 2023-10-02: qty 500

## 2023-10-02 MED ORDER — DOCUSATE SODIUM 50 MG/5ML PO LIQD: 100.0000 mg | Freq: Two times a day (BID) | ORAL | Status: DC | PRN

## 2023-10-02 MED ORDER — SENNOSIDES-DOCUSATE SODIUM 8.6-50 MG PO TABS
1.0000 | ORAL_TABLET | Freq: Two times a day (BID) | ORAL | Status: DC
Start: 2023-10-02 — End: 2023-10-10
  Administered 2023-10-02 – 2023-10-08 (×7): 1
  Filled 2023-10-02 (×7): qty 1

## 2023-10-02 MED ORDER — ACETAMINOPHEN 160 MG/5ML PO SOLN
650.0000 mg | ORAL | Status: DC | PRN
  Administered 2023-10-03 – 2023-10-05 (×3): 650 mg
  Filled 2023-10-02 (×3): qty 20.3

## 2023-10-02 MED ORDER — SENNOSIDES-DOCUSATE SODIUM 8.6-50 MG PO TABS
1.0000 | ORAL_TABLET | Freq: Two times a day (BID) | ORAL | Status: DC
Start: 2023-10-02 — End: 2023-10-02

## 2023-10-02 MED ORDER — SODIUM CHLORIDE 3 % IV SOLN
INTRAVENOUS | Status: DC
  Filled 2023-10-02 (×5): qty 500

## 2023-10-02 MED ORDER — FENTANYL BOLUS VIA INFUSION
50.0000 ug | INTRAVENOUS | Status: DC | PRN
  Administered 2023-10-02 (×2): 100 ug via INTRAVENOUS
  Administered 2023-10-02: 50 ug via INTRAVENOUS
  Administered 2023-10-02 – 2023-10-03 (×2): 100 ug via INTRAVENOUS
  Administered 2023-10-03: 50 ug via INTRAVENOUS
  Administered 2023-10-03 – 2023-10-04 (×3): 100 ug via INTRAVENOUS
  Administered 2023-10-04: 50 ug via INTRAVENOUS
  Administered 2023-10-05 (×2): 100 ug via INTRAVENOUS

## 2023-10-02 NOTE — ED Notes (Signed)
Fentanyl drip initiated.

## 2023-10-02 NOTE — Consult Note (Signed)
NEUROLOGY CONSULT NOTE   Date of service: October 02, 2023 Patient Name: Ryan Silva MRN:  [id number] DOB:  [birth year] Chief Complaint: "found down"  History of Present Illness  Ryan Silva is a 57 y.o. male with hx of HTN, found down by wife. Last seen normal was 1600 on 10/01/23. EMS called and noted to be posturing, GCS 3, hypertensive to 300s systolic. No improvement with narcan. He was foaming at his mouth and had a left gaze. He was intubated in the ED. CT Head demonstrated a large hemorrhage centered in the left basal ganglia with significant mass effect, rightwards midline shift, extension into the ventricles and SAH.  He was reporting some back pain yesterday. CTA C/A/P with no aortic dissection. CT A head and neck with no aneurysm.   Last known well: 1600 on 10/01/23 Modified rankin score: 0-Completely asymptomatic and back to baseline post- stroke ICH Score: 4 tNKASE: Not offered due to ICH Thrombectomy: not offered due to ICH NIHSS components Score: Comment  1a Level of Conscious 0[]  1[]  2[]  3[x]      1b LOC Questions 0[]  1[]  2[x]       1c LOC Commands 0[]  1[]  2[x]       2 Best Gaze 0[x]  1[]  2[]       3 Visual 0[]  1[]  2[x]  3[]      4 Facial Palsy 0[x]  1[]  2[]  3[]      5a Motor Arm - left 0[]  1[]  2[]  3[]  4[x]  UN[]    5b Motor Arm - Right 0[]  1[]  2[]  3[]  4[x]  UN[]    6a Motor Leg - Left 0[]  1[]  2[]  3[]  4[x]  UN[]    6b Motor Leg - Right 0[]  1[]  2[]  3[]  4[x]  UN[]    7 Limb Ataxia 0[x]  1[]  2[]  3[]  UN[]     8 Sensory 0[]  1[]  2[x]  UN[]      9 Best Language 0[]  1[]  2[]  3[x]      10 Dysarthria 0[]  1[]  2[x]  UN[]      11 Extinct. and Inattention 0[x]  1[]  2[]       TOTAL: 32      ROS  Unable to perform due to comatose.  Past History  No past medical history on file. No past surgical history on file. No family history on file. Social History   Socioeconomic History   Marital status: Married    Spouse name: Not on file   Number of children: Not on file   Years of education: Not on  file   Highest education level: Not on file  Occupational History   Not on file  Tobacco Use   Smoking status: Every Day    Current packs/day: 1.00    Average packs/day: 1 pack/day for 10.0 years (10.0 ttl pk-yrs)    Types: Cigarettes   Smokeless tobacco: Not on file  Substance and Sexual Activity   Alcohol use: Yes   Drug use: Yes    Types: Marijuana   Sexual activity: Not on file  Other Topics Concern   Not on file  Social History Narrative   Not on file   Social Determinants of Health   Financial Resource Strain: Not on file  Food Insecurity: Not on file  Transportation Needs: Not on file  Physical Activity: Not on file  Stress: Not on file  Social Connections: Not on file   No Known Allergies  Medications  (Not in a hospital admission)    Vitals   Vitals:   10/02/23 1118 10/02/23 1120 10/02/23 1125 10/02/23 1200  BP: 109/67 108/67 109/67 124/78  Pulse: 73  72 73 66  Resp: 18 18 18 18   Temp:   98.8 F (37.1 C) 98.7 F (37.1 C)  SpO2: 100% 100% 100% 100%  Weight:      Height:         Body mass index is 22.14 kg/m.  Physical Exam   General: Laying comfortably in bed; intubated. HENT: Normal oropharynx and mucosa. Normal external appearance of ears and nose. Blood on the left lateral aspect of his tongue and in his mouth Neck: Supple, no pain or tenderness  CV: No JVD. No peripheral edema.  Pulmonary: Symmetric Chest rise. Biot's breathing pattern. Abdomen: Soft to touch, non-tender. Ext: No cyanosis, edema, or deformity Skin: No rash. Normal palpation of skin.  Musculoskeletal: Normal digits and nails by inspection. No clubbing.  Neurologic Examination  Mental status/Cognition: comatose, no response to voice or loud clap. Speech/language: mute, no speech Cranial nerves:   CN II Pupils small and pipoint and reactive to bright light.   CN III,IV,VI Dolls eye reflex is absent, no gaze preference or deviation   CN V + corneals BL   CN VII Facial  diplegia   CN VIII Does not turn head towards voice   CN IX & X Cough present, no gag.   CN XI Head midline   CN XII midline tongue   Sensory/Motor:  Muscle bulk: normal, tone flaccid in all extremities No movement to proximal pinch in any of the extremities.   Labs   CBC:  Recent Labs  Lab 10/02/23 0932 10/02/23 1010 10/02/23 1129  WBC 20.1*  --   --   NEUTROABS 11.9*  --   --   HGB 18.9* 19.7*  19.7* 12.9*  HCT 57.0* 58.0*  58.0* 38.0*  MCV 93.1  --   --   PLT 165  --   --     Basic Metabolic Panel:  Lab Results  Component Value Date   NA 129 (L) 10/02/2023   K 3.1 (L) 10/02/2023   CO2 13 (L) 10/02/2023   GLUCOSE 176 (H) 10/02/2023   BUN 17 10/02/2023   CREATININE 1.40 (H) 10/02/2023   CALCIUM 9.9 10/02/2023   GFRNONAA 43 (L) 10/02/2023   Lipid Panel: No results found for: "LDLCALC" HgbA1c: No results found for: "HGBA1C" Urine Drug Screen: No results found for: "LABOPIA", "COCAINSCRNUR", "LABBENZ", "AMPHETMU", "THCU", "LABBARB"  Alcohol Level No results found for: "ETH" INR No results found for: "INR" APTT No results found for: "APTT"   CT Head without contrast(Personally reviewed): 1. Severe Intracranial Hemorrhage: Large volume Left Basal ganglia bleed (97 mL) with extensive IVH and SAH. Intracranial mass effect with rightward midline shift of 10 mm. Combined trapped and effaced ventricles. Transependymal edema. Effaced basilar cisterns. This was discussed with Dr.  Derry Lory on 10/02/2023 at 0958 hours.  CT angio Head and Neck with contrast(Personally reviewed): Intracranial CTA is remarkable for significantly attenuated - but not lost - enhancement of all anterior and posterior circulation branches. Differential considerations include diffuse intracranial vasospasm versus increasing intracranial pressure. This was communicated to Dr. Derry Lory at 1007 hours via the [facility] messaging system.   3. Negative for CTA spot sign.  No intracranial  aneurysm identified. Positive for age advanced atherosclerosis with hemodynamically significant stenoses: - left ICA siphon moderate stenosis. - left ICA bulb 65% stenosis. - right ICA siphon mild to moderate stenosis. - Severe stenosis of the Right Vertebral Artery origin.  MRI Brain: pending  Impression   Lizbeth Wodrich is a 57 y.o. male found  down, left gaze, posturing, patchy brainstem reflexes with Biot's breathing pattern. Found to have large 97ml ICH centered in the left basal ganglia with significant midline shift, mass effect, Intraventricular extension and SAH with attenuated vasculature on CT angio.  Likely this is a devastating injury with an ICH score of 4. I spoke with his sister and discussed poor prognosis.  Primary Diagnosis:  Nontraumatic intracerebral hemorrhage in hemisphere, subcortical  Secondary Diagnosis: Hypertension Emergency (SBP > 180 or DBP > 120 & end organ damage)  Recommendations  Acute Left Basal Ganglia hemorrhage with intraventricular hemorrhage, brain compression with midline shift of brain and brain herniation: - Admit to ICU - Stability scan in 6 hours or STAT with any neurological decline - Frequent neuro checks; q25min for 1 hour, then q1hour - No antiplatelets or anticoagulants due to ICH - SCD for DVT prophylaxis, pharmacological DVT ppx at 24 hours if ICH is stable - Blood pressure control with goal systolic 130 - 150, cleverplex and labetalol PRN - Stroke labs, HgbA1c, fasting lipid panel - MRI brain with and without contrast when stabilized to evaluate for underlying mass - Risk factor modification - Echocardiogram - PT consult, OT consult, Speech consult. - Stroke team to follow - Neurosurgery consulted. EVD placed. - Hypertonic saline 250cc bolus, 75cc/hr with goal Sodium of 150-155. - Mannitol 1g/Kg once ordered. ______________________________________________________________________  This patient is critically ill and at  significant risk of neurological worsening, death and care requires constant monitoring of vital signs, hemodynamics,respiratory and cardiac monitoring, neurological assessment, discussion with family, other specialists and medical decision making of high complexity. I spent 105 minutes of neurocritical care time  in the care of  this patient. This was time spent independent of any time provided by nurse practitioner or PA.  Erick Blinks [facility] 10/02/2023  12:32 PM   Signed,  Erick Blinks, MD [facility]

## 2023-10-02 NOTE — H&P (Signed)
NAME:  Ryan Silva, MRN:  [id number], DOB:  1966/05/14, LOS: 0 ADMISSION DATE:  10/02/2023, CONSULTATION DATE: 10/02/2023 REFERRING MD:  Rolla Flatten, MD , CHIEF COMPLAINT: Unresponsiveness  History of Present Illness:  57 year old male who was brought into the emergency department after he was found down by the bedside by his wife.  His last known well was 4 PM yesterday, upon arrival to ED patient was noted to be posturing, his GCS was 3 with systolic blood pressure ranging in 300s.  He was noted to have pinpoint pupils, he was given Narcan without much improvement, he was noted to vomiting from the mouth with a left gaze, he was immediately intubated.  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 CTA abdomen/pelvis/head and neck showed no aneurysm or aortic dissection. Patient was evaluated by neurosurgery, EVD was placed, evaluated by neurology, recommend admission to ICU  Pertinent  Medical History  Hypertension Significant Hospital Events: Including procedures, antibiotic start and stop dates in addition to other pertinent events     Interim History / Subjective:  As above  Objective   Blood pressure 107/74, pulse 63, temperature 98.7 F (37.1 C), resp. rate 17, height 5\' 10"  (1.778 m), weight 70 kg, SpO2 94%.    Vent Mode: PRVC FiO2 (%):  [40 %-100 %] 40 % Set Rate:  [10 bmp-18 bmp] 10 bmp Vt Set:  [440 mL-550 mL] 440 mL PEEP:  [5 cmH20] 5 cmH20 Plateau Pressure:  [14 cmH20-16 cmH20] 16 cmH20   Intake/Output Summary (Last 24 hours) at 10/02/2023 1513 Last data filed at 10/02/2023 1300 Gross per 24 hour  Intake 1080.32 ml  Output 43 ml  Net 1037.32 ml   Filed Weights   10/02/23 0900  Weight: 70 kg    Examination: General: Crtitically ill-appearing male, orally intubated HEENT: [place]/AT, eyes  anicteric.  ETT in place Neuro: Eyes closed, does not open, not following commands, pupils pinpoint, extensor posturing with painful stimuli, positive cough and gag Chest: Coarse breath sounds, no wheezes or rhonchi Heart: Regular rate and rhythm, no murmurs or gallops Abdomen: Soft, nondistended, bowel sounds present Skin: No rash   Labs and images reviewed  Resolved Hospital Problem list   As above  Assessment & Plan:  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, ICH score of 4, with expected mortality 97% Severe cerebral edema with brain compression and midline shift Brain herniation syndrome Hypertensive emergency Acute obstructive hydrocephalus  Admit to ICU Continue neuro watch Neurology and neurosurgery are following Monitor EVD output, keep EVD at 10 cm of water Patient will have repeat CT scan in 6 hours and MRI brain Echocardiogram is pending Monitor blood pressure, titrate to keep SBP goal 130-150 Currently on clevidipine infusion Avoid anticoagulation Patient was given hypertonic saline bolus, he will be started on 75 cc of hypertonic saline with serum sodium goal 150-155 He received mannitol 1 g/kg x 1   Guarded prognosis   Best Practice (right click and "Reselect all SmartList Selections" daily)   Diet/type: NPO DVT prophylaxis: SCDs Start: 10/02/23 1129   Pressure ulcer(s): not present on admission  GI prophylaxis: PPI Lines: N/A Foley:  Yes, and it is still needed Code Status:  full code Last date of multidisciplinary goals of care discussion [pending]  Labs   CBC: Recent Labs  Lab 10/02/23 0932 10/02/23 1010 10/02/23 1129  WBC  20.1*  --   --   NEUTROABS 11.9*  --   --   HGB 18.9* 19.7*  19.7* 12.9*  HCT 57.0* 58.0*  58.0* 38.0*  MCV 93.1  --   --   PLT 165  --   --     Basic Metabolic Panel: Recent Labs  Lab 10/02/23 0932 10/02/23 1010 10/02/23 1129  NA 141 140  140 129*  K 3.7 3.6   3.7 3.1*  CL 100 106  --   CO2 13*  --   --   GLUCOSE 178* 176*  --   BUN 15 17  --   CREATININE 1.81* 1.40*  --   CALCIUM 9.9  --   --    GFR: Estimated Creatinine Clearance: 58.3 mL/min (A) (by C-G formula based on SCr of 1.4 mg/dL (H)). Recent Labs  Lab 10/02/23 0932  WBC 20.1*    Liver Function Tests: Recent Labs  Lab 10/02/23 0932  AST 104*  ALT 76*  ALKPHOS 113  BILITOT 1.1  PROT 8.9*  ALBUMIN 4.1   No results for input(s): "LIPASE", "AMYLASE" in the last 168 hours. No results for input(s): "AMMONIA" in the last 168 hours.  ABG    Component Value Date/Time   PHART 7.311 (L) 10/02/2023 1129   PCO2ART 35.5 10/02/2023 1129   PO2ART 503 (H) 10/02/2023 1129   HCO3 17.9 (L) 10/02/2023 1129   TCO2 19 (L) 10/02/2023 1129   ACIDBASEDEF 8.0 (H) 10/02/2023 1129   O2SAT 100 10/02/2023 1129     Coagulation Profile: No results for input(s): "INR", "PROTIME" in the last 168 hours.  Cardiac Enzymes: Recent Labs  Lab 10/02/23 0932  CKTOTAL 179    HbA1C: Hgb A1c MFr Bld  Date/Time Value Ref Range Status  10/02/2023 09:32 AM 5.7 (H) 4.8 - 5.6 % Final    Comment:    (NOTE) Pre diabetes:          5.7%-6.4%  Diabetes:              >6.4%  Glycemic control for   <7.0% adults with diabetes     CBG: No results for input(s): "GLUCAP" in the last 168 hours.  Review of Systems:   Unable to obtain as patient is intubated  Past Medical History:  He,  has no past medical history on file.   Surgical History:  No past surgical history on file.   Social History:   reports that he has been smoking cigarettes. He has a 10 pack-year smoking history. He does not have any smokeless tobacco history on file. He reports current alcohol use. He reports current drug use. Drug: Marijuana.   Family History:  His family history is not on file.   Allergies No Known Allergies   Home Medications  Prior to Admission medications   Medication Sig Start Date End Date Taking?  Authorizing Provider  acetaminophen (TYLENOL) 500 MG tablet Take 500-1,000 mg by mouth every 6 (six) hours as needed for moderate pain (pain score 4-6).   Yes [provider]     Critical care time:      The patient is critically ill due to acute intraparenchymal basal ganglia hemorrhage/cerebral edema/brain herniation.  Critical care was necessary to treat or prevent imminent or life-threatening deterioration.  Critical care was time spent personally by me on the following activities: development of treatment plan with patient and/or surrogate as well as nursing, discussions with consultants, evaluation of patient's response to treatment, examination of patient,  obtaining history from patient or surrogate, ordering and performing treatments and interventions, ordering and review of laboratory studies, ordering and review of radiographic studies, pulse oximetry, re-evaluation of patient's condition and participation in multidisciplinary rounds.   During this encounter critical care time was devoted to patient care services described in this note for 55 minutes.     Cheri Fowler, MD [facility] See Amion for pager If no response to pager, please call [phone] until 7pm After 7pm, Please call E-link [phone]

## 2023-10-02 NOTE — ED Provider Notes (Signed)
[place] EMERGENCY DEPARTMENT AT [facility] Provider Note   CSN: [id number] Arrival date & time: 10/02/23  4098     History  Chief Complaint  Patient presents with   Altered Mental Status    Ryan Silva is a 57 y.o. male.  57 year old male with no known past medical history presents here unresponsive.  He was reportedly found by his wife this morning.  Last known well was 4 PM yesterday.  The patient's wife states that he was not responsive to her when she discovered him.  EMS was called.  EMS noted posturing behavior in the field.  They administered 2 mg of Narcan without improvement in symptoms.  Patient required respiratory support with Ambu bag and route.  He was noted to be significantly hypertensive with EMS.  On arrival here, patient is unresponsive.  Demonstrating extensor posturing behavior in the left upper extremity.  The history is provided by the EMS personnel. The history is limited by the condition of the patient.       Home Medications Prior to Admission medications   Medication Sig Start Date End Date Taking? Authorizing Provider  acetaminophen (TYLENOL) 500 MG tablet Take 500-1,000 mg by mouth every 6 (six) hours as needed for moderate pain (pain score 4-6).   Yes [provider]      Allergies    Patient has no known allergies.    Review of Systems   As noted in HPI  Physical Exam Updated Vital Signs BP 107/74   Pulse 63   Temp 98.7 F (37.1 C)   Resp 17   Ht 5\' 10"  (1.778 m)   Wt 70 kg   SpO2 94%   BMI 22.14 kg/m  Physical Exam Vitals reviewed.  Constitutional:      General: He is in acute distress.     Appearance: He is ill-appearing. He is not toxic-appearing or diaphoretic.  HENT:     Head:     Comments: Area of ecchymosis noted to the head.    Mouth/Throat:     Comments: Blood noted in the oropharynx Cardiovascular:     Rate and Rhythm: Normal rate and regular rhythm.     Heart sounds: Normal heart sounds. No  murmur heard.    No friction rub. No gallop.  Pulmonary:     Effort: Respiratory distress present.  Skin:    General: Skin is warm and dry.     ED Results / Procedures / Treatments   Labs (all labs ordered are listed, but only abnormal results are displayed) Labs Reviewed  COMPREHENSIVE METABOLIC PANEL - Abnormal; Notable for the following components:      Result Value   CO2 13 (*)    Glucose, Bld 178 (*)    Creatinine, Ser 1.81 (*)    Total Protein 8.9 (*)    AST 104 (*)    ALT 76 (*)    GFR, Estimated 43 (*)    Anion gap 28 (*)    All other components within normal limits  CBC WITH DIFFERENTIAL/PLATELET - Abnormal; Notable for the following components:   WBC 20.1 (*)    RBC 6.12 (*)    Hemoglobin 18.9 (*)    HCT 57.0 (*)    Neutro Abs 11.9 (*)    Lymphs Abs 6.4 (*)    Monocytes Absolute 1.2 (*)    nRBC 1 (*)    Abs Immature Granulocytes 0.40 (*)    All other components within normal limits  RAPID  URINE DRUG SCREEN, HOSP PERFORMED - Abnormal; Notable for the following components:   Tetrahydrocannabinol POSITIVE (*)    All other components within normal limits  URINALYSIS, ROUTINE W REFLEX MICROSCOPIC - Abnormal; Notable for the following components:   Glucose, UA 150 (*)    Hgb urine dipstick LARGE (*)    Protein, ur >=300 (*)    All other components within normal limits  HEMOGLOBIN A1C - Abnormal; Notable for the following components:   Hgb A1c MFr Bld 5.7 (*)    All other components within normal limits  GLUCOSE, CAPILLARY - Abnormal; Notable for the following components:   Glucose-Capillary 169 (*)    All other components within normal limits  I-STAT CHEM 8, ED - Abnormal; Notable for the following components:   Creatinine, Ser 1.40 (*)    Glucose, Bld 176 (*)    Calcium, Ion 0.99 (*)    TCO2 17 (*)    Hemoglobin 19.7 (*)    HCT 58.0 (*)    All other components within normal limits  I-STAT VENOUS BLOOD GAS, ED - Abnormal; Notable for the following  components:   pH, Ven 7.217 (*)    pCO2, Ven 39.4 (*)    pO2, Ven 153 (*)    Bicarbonate 16.0 (*)    TCO2 17 (*)    Acid-base deficit 11.0 (*)    Calcium, Ion 1.00 (*)    HCT 58.0 (*)    Hemoglobin 19.7 (*)    All other components within normal limits  I-STAT ARTERIAL BLOOD GAS, ED - Abnormal; Notable for the following components:   pH, Arterial 7.311 (*)    pO2, Arterial 503 (*)    Bicarbonate 17.9 (*)    TCO2 19 (*)    Acid-base deficit 8.0 (*)    Sodium 129 (*)    Potassium 3.1 (*)    Calcium, Ion 1.02 (*)    HCT 38.0 (*)    Hemoglobin 12.9 (*)    All other components within normal limits  TROPONIN I (HIGH SENSITIVITY) - Abnormal; Notable for the following components:   Troponin I (High Sensitivity) 82 (*)    All other components within normal limits  CK  SODIUM  SODIUM  HIV ANTIBODY (ROUTINE TESTING W REFLEX)  SODIUM  I-STAT CG4 LACTIC ACID, ED  I-STAT CG4 LACTIC ACID, ED  TROPONIN I (HIGH SENSITIVITY)    EKG None  Radiology DG CHEST PORT 1 VIEW  Result Date: 10/02/2023 CLINICAL DATA:  Abnormal respiration. EXAM: PORTABLE CHEST 1 VIEW COMPARISON:  None Available. FINDINGS: The heart size and mediastinal contours are within normal limits. Endotracheal and nasogastric tubes are grossly good position. Both lungs are clear. The visualized skeletal structures are unremarkable. IMPRESSION: No active disease. Electronically Signed   By: Lupita Raider M.D.   On: 10/02/2023 12:30   DG Abd 1 View  Result Date: 10/02/2023 CLINICAL DATA:  Altered mental status, abnormal respiration. EXAM: ABDOMEN - 1 VIEW COMPARISON:  None Available. FINDINGS: The bowel gas pattern is normal. Nasogastric tube tip is seen in proximal stomach. No radio-opaque calculi or other significant radiographic abnormality are seen. IMPRESSION: No abnormal bowel dilatation. Electronically Signed   By: Lupita Raider M.D.   On: 10/02/2023 12:29   CT ANGIO HEAD NECK W WO CM (CODE STROKE)  Result Date:  10/02/2023 CLINICAL DATA:  57 year old male code stroke presentation. Neurologic deficit. Chest pain. Sepsis. EXAM: CT ANGIOGRAPHY HEAD AND NECK WITH AND WITHOUT CONTRAST TECHNIQUE: Multidetector CT imaging of  the head and neck was performed using the standard protocol during bolus administration of intravenous contrast. Multiplanar CT image reconstructions and MIPs were obtained to evaluate the vascular anatomy. Carotid stenosis measurements (when applicable) are obtained utilizing NASCET criteria, using the distal internal carotid diameter as the denominator. RADIATION DOSE REDUCTION: This exam was performed according to the departmental dose-optimization program which includes automated exposure control, adjustment of the mA and/or kV according to patient size and/or use of iterative reconstruction technique. CONTRAST:  OMNIPAQUE IOHEXOL 300 MG/ML  SOLN COMPARISON:  None Available. FINDINGS: CT HEAD Brain: Severe intracranial hemorrhage. Large volume left hemisphere hyperdense intra-axial blood, epicenter at the left basal ganglia. Fairly extensive intraventricular and extra-axial blood extension. Estimated intra-axial blood volume is 97 mL (78 x 40 x 62 mm (AP by transverse by CC)). Large volume IVH. And moderate volume subarachnoid hemorrhage including in the basilar cisterns greater on the left, in the left sylvian fissure. There could be direct extension from the intra-axial blood to the subarachnoid space on series 2, image 16. Intracranial mass effect with effaced ventricles, rightward midline shift of 10 mm. Trapped occipital horns and right temporal horn. Transependymal edema. Effaced basilar cisterns. No definite tonsillar herniation. Brainstem seems hypodense on series 2, image 13, but might be artifactual due to the surrounding hyperdense blood. There is evidence of transependymal edema in the cerebral hemispheres, probably also advanced bilateral underlying white matter hypodensity. Calvarium  and skull base: No skull fracture identified. Paranasal sinuses: Intubated. Well aerated paranasal sinuses, mastoids, tympanic cavities. Orbits: Disconjugate gaze. No discrete orbit or scalp soft tissue injury. Preliminary report of the above discussed by telephone with Dr. Derry Lory on 10/02/2023 at 0958 hours CTA NECK Skeleton: No acute osseous abnormality identified. Upper chest: Intubated. Endotracheal tube tip terminates above the carina. Small to moderate volume retained secretions in the lower trachea and carina but overall the visible major airways are patent. Relatively mild dependent atelectasis in the visible lungs. Central pulmonary arteries are enhancing and patent. No superior mediastinal mass or lymphadenopathy. Other neck: Intubated with fluid in the pharynx. Otherwise negative. Aortic arch: 3 vessel arch. Mild arch atherosclerosis. Partially visible tortuosity of the descending thoracic aorta, up to 34 mm diameter. Right carotid system: Brachiocephalic artery and right CCA origin are normal. Negative right CCA. Patent right carotid bifurcation with minimal irregularity. Patent right ICA to the skull base with mild tortuosity, no plaque or stenosis. Left carotid system: Similar mild tortuosity with no CCA plaque. Mild soft and calcified plaque at the left ICA origin. But bulky soft and calcified plaque at the distal left ICA bulb as seen on series 7, image 100 and series 12, image 33. Subsequent stenosis there numerically estimated at 65 % with respect to the distal vessel. Left ICA remains patent to the skull base. Vertebral arteries: Proximal right subclavian artery is patent with no significant plaque or stenosis. However, there is moderate to severe soft plaque and stenosis at the right vertebral artery origin on series 8, image 148. Right vertebral artery remains patent with mild additional V1 irregularity. Right vertebral artery remains enhancing to the skull base although with diminishing  contrast density compared to the left side. See intracranial findings below. Proximal left subclavian artery and left vertebral artery origin are normal. Codominant left vertebral artery is enhancing and appears normal to the skull base. CTA HEAD Posterior circulation: Attenuated bilateral distal vertebral arteries, but earlier loss of enhancement in the right V4 segment as seen on series 5, image 153.  Distal to the patent left PICA the left vertebral is irregular and stenotic. The vertebrobasilar junction is stenotic. The basilar artery is stenotic and irregular. But enhancement continues to the basilar tip as seen on series 11, image 33. SCA and PCA origins are patent. But irregular. There is symmetric bilateral PCA branch enhancement which is irregular. Anterior circulation: Right ICA siphon is patent to the terminus with calcified siphon atherosclerosis. Mild to moderate right siphon stenosis. Contralateral left ICA siphon is enhancing but asymmetrically attenuated especially in the supraclinoid segment which appears moderately to severely stenotic series 9, image 147. Underlying moderate left siphon plaque elsewhere. MCA and ACA origins are patent but irregular (series 11, image 35). ACA branch enhancement is moderately attenuated but is maintained. See series 12, image 30. Bilateral MCA branch enhance mint similarly maintained but attenuated in a fairly symmetric pattern (series 10, image 20). There is mass effect on the left MCA branches from the intra-axial hemorrhage. No CTA spot sign. No intracranial aneurysm is identified. Posterior communicating arteries are diminutive or absent. Venous sinuses: Not evaluated due to early contrast timing. Anatomic variants: None significant. Review of the MIP images confirms the above findings IMPRESSION: 1. Severe Intracranial Hemorrhage: Large volume Left Basal ganglia bleed (97 mL) with extensive IVH and SAH. Intracranial mass effect with rightward midline shift of 10  mm. Combined trapped and effaced ventricles. Transependymal edema. Effaced basilar cisterns. This was discussed with Dr.  Derry Lory on 10/02/2023 at 0958 hours. 2. Intracranial CTA is remarkable for significantly attenuated - but not lost - enhancement of all anterior and posterior circulation branches. Differential considerations include diffuse intracranial vasospasm versus increasing intracranial pressure. This was communicated to Dr. Derry Lory at 1007 hours via the [facility] messaging system. 3. Negative for CTA spot sign.  No intracranial aneurysm identified. Positive for age advanced atherosclerosis with hemodynamically significant stenoses: - left ICA siphon moderate stenosis. - left ICA bulb 65% stenosis. - right ICA siphon mild to moderate stenosis. - Severe stenosis of the Right Vertebral Artery origin. 4. Satisfactory endotracheal tube position. CTA chest abdomen pelvis reported separately. Electronically Signed   By: Odessa Fleming M.D.   On: 10/02/2023 10:33   CT CHEST ABDOMEN PELVIS WO CONTRAST  Result Date: 10/02/2023 CLINICAL DATA:  57 year old male code stroke presentation, intracranial hemorrhage. Chest pain. Sepsis. EXAM: CT CHEST, ABDOMEN AND PELVIS WITHOUT CONTRAST CT ANGIOGRAPHY CHEST, ABDOMEN AND PELVIS TECHNIQUE: Non-contrast CT of the chest abdomen and pelvis was initially obtained at 0950 hours. Multidetector CT imaging through the chest, abdomen and pelvis was performed at 1006 hours using the standard protocol during bolus administration of intravenous contrast. Multiplanar reconstructed images and MIPs were obtained and reviewed to evaluate the vascular anatomy. RADIATION DOSE REDUCTION: This exam was performed according to the departmental dose-optimization program which includes automated exposure control, adjustment of the mA and/or kV according to patient size and/or use of iterative reconstruction technique. CONTRAST:  75mL OMNIPAQUE IOHEXOL 300 MG/ML  SOLN COMPARISON:  CTA head and  neck today reported separately. FINDINGS: CT/CTA CHEST FINDINGS Cardiovascular: Heart size within normal limits. No pericardial effusion. Some calcified coronary artery plaque prior to contrast series 3, image 34. Mild tortuosity and atherosclerosis of the thoracic aorta. Negative for thoracic aortic dissection or aneurysm. Central pulmonary artery enhancement better demonstrated on the neck CTA reported separately. Mediastinum/Nodes: Negative. No mediastinal mass or lymphadenopathy. Lungs/Pleura: Intubated on both imaging sets. Unchanged mild to moderate dependent secretions in the lower trachea and at the carina. Major airways  remain patent. Dependent and enhancing atelectasis in the lower lobes appears unchanged on the pre versus postcontrast images. No pleural effusion, pneumothorax, or consolidation. Musculoskeletal: No displaced rib fracture identified. Thoracic spine, visible shoulder osseous structures and sternum appear intact. Review of the MIP images confirms the above findings. CT/CTA ABDOMEN AND PELVIS FINDINGS VASCULAR Aortoiliac calcified atherosclerosis. Negative for abdominal aortic aneurysm or dissection. Major arterial structures in the abdomen and pelvis are patent. Portal venous system not evaluated. Review of the MIP images confirms the above findings. NON-VASCULAR Hepatobiliary: Negative noncontrast and early postcontrast appearance of the liver and gallbladder. Pancreas: Negative. Spleen: Negative. Adrenals/Urinary Tract: Normal adrenal glands. Nonobstructed kidneys with symmetric renal enhancement and contrast excretion. Excreted contrast in the bladder on the 2nd imaging set. Stomach/Bowel: Mostly decompressed large and small bowel. Negative retrocecal appendix. Decompressed stomach and duodenum. No free air or free fluid. Somewhat indistinct appearance of the right colon on both sets of images but no convincing mesenteric inflammation. Lymphatic: No evidence of lymphadenopathy.  Reproductive: Negative. Other: No pelvis free fluid. Musculoskeletal: No acute osseous abnormality identified. Review of the MIP images confirms the above findings. IMPRESSION: 1. Mildly tortuous aorta with Aortic Atherosclerosis (ICD10-I70.0). Negative for Aortic Dissection or Aneurysm. 2. Intubated with mild dependent atelectasis. 3. No other acute or inflammatory process identified in the chest, abdomen, or pelvis on noncontrast CT or subsequent CTA. Electronically Signed   By: Odessa Fleming M.D.   On: 10/02/2023 10:32   CT Angio Chest/Abd/Pel for Dissection W and/or Wo Contrast  Result Date: 10/02/2023 CLINICAL DATA:  57 year old male code stroke presentation, intracranial hemorrhage. Chest pain. Sepsis. EXAM: CT CHEST, ABDOMEN AND PELVIS WITHOUT CONTRAST CT ANGIOGRAPHY CHEST, ABDOMEN AND PELVIS TECHNIQUE: Non-contrast CT of the chest abdomen and pelvis was initially obtained at 0950 hours. Multidetector CT imaging through the chest, abdomen and pelvis was performed at 1006 hours using the standard protocol during bolus administration of intravenous contrast. Multiplanar reconstructed images and MIPs were obtained and reviewed to evaluate the vascular anatomy. RADIATION DOSE REDUCTION: This exam was performed according to the departmental dose-optimization program which includes automated exposure control, adjustment of the mA and/or kV according to patient size and/or use of iterative reconstruction technique. CONTRAST:  75mL OMNIPAQUE IOHEXOL 300 MG/ML  SOLN COMPARISON:  CTA head and neck today reported separately. FINDINGS: CT/CTA CHEST FINDINGS Cardiovascular: Heart size within normal limits. No pericardial effusion. Some calcified coronary artery plaque prior to contrast series 3, image 34. Mild tortuosity and atherosclerosis of the thoracic aorta. Negative for thoracic aortic dissection or aneurysm. Central pulmonary artery enhancement better demonstrated on the neck CTA reported separately.  Mediastinum/Nodes: Negative. No mediastinal mass or lymphadenopathy. Lungs/Pleura: Intubated on both imaging sets. Unchanged mild to moderate dependent secretions in the lower trachea and at the carina. Major airways remain patent. Dependent and enhancing atelectasis in the lower lobes appears unchanged on the pre versus postcontrast images. No pleural effusion, pneumothorax, or consolidation. Musculoskeletal: No displaced rib fracture identified. Thoracic spine, visible shoulder osseous structures and sternum appear intact. Review of the MIP images confirms the above findings. CT/CTA ABDOMEN AND PELVIS FINDINGS VASCULAR Aortoiliac calcified atherosclerosis. Negative for abdominal aortic aneurysm or dissection. Major arterial structures in the abdomen and pelvis are patent. Portal venous system not evaluated. Review of the MIP images confirms the above findings. NON-VASCULAR Hepatobiliary: Negative noncontrast and early postcontrast appearance of the liver and gallbladder. Pancreas: Negative. Spleen: Negative. Adrenals/Urinary Tract: Normal adrenal glands. Nonobstructed kidneys with symmetric renal enhancement and contrast  excretion. Excreted contrast in the bladder on the 2nd imaging set. Stomach/Bowel: Mostly decompressed large and small bowel. Negative retrocecal appendix. Decompressed stomach and duodenum. No free air or free fluid. Somewhat indistinct appearance of the right colon on both sets of images but no convincing mesenteric inflammation. Lymphatic: No evidence of lymphadenopathy. Reproductive: Negative. Other: No pelvis free fluid. Musculoskeletal: No acute osseous abnormality identified. Review of the MIP images confirms the above findings. IMPRESSION: 1. Mildly tortuous aorta with Aortic Atherosclerosis (ICD10-I70.0). Negative for Aortic Dissection or Aneurysm. 2. Intubated with mild dependent atelectasis. 3. No other acute or inflammatory process identified in the chest, abdomen, or pelvis on  noncontrast CT or subsequent CTA. Electronically Signed   By: Odessa Fleming M.D.   On: 10/02/2023 10:32    Procedures Procedure Name: Intubation Date/Time: 10/02/2023 9:34 AM  Performed by: Rolla Flatten, MDPre-anesthesia Checklist: Patient identified, Emergency Drugs available, Suction available and Patient being monitored Oxygen Delivery Method: Ambu bag Preoxygenation: Pre-oxygenation with 100% oxygen Induction Type: IV induction and Rapid sequence Ventilation: Mask ventilation without difficulty Laryngoscope Size: Mac and 3 Grade View: Grade II Tube size: 7.5 mm Number of attempts: 1 Airway Equipment and Method: Oral airway Placement Confirmation: ETT inserted through vocal cords under direct vision, Positive ETCO2, CO2 detector and Breath sounds checked- equal and bilateral Secured at: 26 cm Tube secured with: ETT holder    ARTERIAL LINE  Date/Time: 10/02/2023 11:41 AM  Performed by: Rolla Flatten, MD Authorized by: Glendora Score, MD   Consent:    Consent obtained:  Emergent situation Universal protocol:    Immediately prior to procedure, a time out was called: yes     Patient identity confirmed:  Arm band and hospital-assigned identification number Indications:    Indications: hemodynamic monitoring   Pre-procedure details:    Skin preparation:  Chlorhexidine   Preparation: Patient was prepped and draped in sterile fashion   Sedation:    Sedation type:  Deep Anesthesia:    Anesthesia method:  None Procedure details:    Location:  L radial   Needle gauge:  20 G   Placement technique:  Seldinger and ultrasound guided   Number of attempts:  2   Transducer: waveform confirmed   Post-procedure details:    Post-procedure:  Sutured and sterile dressing applied   CMS:  Unable to assess   Procedure completion:  Tolerated well, no immediate complications     Medications Ordered in ED Medications  propofol (DIPRIVAN) 1000 MG/100ML infusion (60 mcg/kg/min  70 kg  Intravenous Infusion Verify 10/02/23 1300)  fentaNYL (SUBLIMAZE) injection 50 mcg (50 mcg Intravenous Not Given 10/02/23 1117)  fentaNYL in NS (56mcg/ml) infusion-PREMIX (125 mcg/hr Intravenous Infusion Verify 10/02/23 1300)  fentaNYL (SUBLIMAZE) bolus via infusion 50-100 mcg (100 mcg Intravenous Bolus from Bag 10/02/23 1350)  labetalol (NORMODYNE) injection 20 mg (20 mg Intravenous Given 10/02/23 0947)    And  clevidipine (CLEVIPREX) infusion 0.5 mg/mL (4 mg/hr Intravenous Infusion Verify 10/02/23 1300)   stroke: early stages of recovery book (has no administration in time range)  acetaminophen (TYLENOL) tablet 650 mg (has no administration in time range)    Or  acetaminophen (TYLENOL) 160 MG/5ML solution 650 mg (has no administration in time range)    Or  acetaminophen (TYLENOL) suppository 650 mg (has no administration in time range)  pantoprazole (PROTONIX) injection 40 mg (has no administration in time range)  sodium chloride (hypertonic) 3 % solution ( Intravenous Infusion Verify 10/02/23 1300)  ceFAZolin (ANCEF) IVPB 2g/100  mL premix (0 g Intravenous Stopped 10/02/23 1147)  docusate (COLACE) 50 MG/5ML liquid 100 mg (has no administration in time range)  polyethylene glycol (MIRALAX / GLYCOLAX) packet 17 g (has no administration in time range)  0.9 %  sodium chloride infusion ( Intravenous New Bag/Given 10/02/23 1357)  levETIRAcetam (KEPPRA) IVPB 500 mg/100 mL premix (0 mg Intravenous Stopped 10/02/23 1312)  senna-docusate (Senokot-S) tablet 1 tablet (has no administration in time range)  sodium chloride 3% (hypertonic) IV bolus 250 mL (0 mLs Intravenous Stopped 10/02/23 1113)  mannitol 20 % infusion 75 g (0 g Intravenous Stopped 10/02/23 1129)  iohexol (OMNIPAQUE) 300 MG/ML solution 75 mL (75 mLs Intravenous Contrast Given 10/02/23 1014)  iohexol (OMNIPAQUE) 300 MG/ML solution 100 mL (100 mLs Intravenous Contrast Given 10/02/23 1018)  labetalol (NORMODYNE) injection 20  mg (20 mg Intravenous Given 10/02/23 1008)    ED Course/ Medical Decision Making/ A&P                                 Medical Decision Making Amount and/or Complexity of Data Reviewed Independent Historian: EMS Labs: ordered. Radiology: ordered and independent interpretation performed.  Risk Prescription drug management. Decision regarding hospitalization.   57 year old male arrives here via EMS unresponsive.  Was found unresponsive by his wife this morning.  Last known well was 4 PM yesterday.  Patient has no known past medical history.  However, does not seek health care regularly and does not follow with a primary care doctor.  Patient's wife did mention to EMS that he was complaining of back pain yesterday.  EMS observed posturing behavior in the field.  Patient was hypertensive.  Patient was GCS of 3 on arrival for EMS and remained GCS of 3 until arrival here.  Upon arrival to this ED, patient is unresponsive.  He is receiving respiratory ventilation assist with Ambu bag.  He does not respond to noxious stimuli.  IV access obtained.  EMS did give 2 mg of Narcan in the field without response.  2 mg IV Narcan given here without response.  Patient then emergently intubated for airway protection given his poor GCS and mental status.  Patient started on propofol and fentanyl infusion for sedation and analgesia. Patient noted to have ST changes on telemetry, which could be concerning for STEMI versus spontaneous intracranial hemorrhage.  Initial differential diagnosis includes spontaneous intracranial hemorrhage, STEMI, overdose, seizure, aortic dissection.  Patient emergently taken to the scanner given high concerns for spontaneous intracranial hemorrhage versus aortic dissection in the setting of ST changes noted on telemetry.  CT head without contrast obtained.  I independently reviewed this image and radiology.  Patient noted to have large intracranial hemorrhage, intraparenchymal versus  intraventricular, with associated subarachnoid hemorrhage noted.  Patient emergently given IV labetalol for blood pressure control.  Cleviprex infusion initiated.  CTA head and neck as well as CTA chest/abdomen/pelvis obtained to evaluate for aneurysm rupture and aortic dissection.  No dissection of the aorta noted on imaging on my review.  I independently reviewed patient's CTA head and neck.  No large aneurysm appreciated on my review.  Neurology and neurosurgery were emergently consulted for spontaneous intracranial hemorrhage.  Sedation and Cleviprex titrated to maintain SBP less than 150 but greater than 130.  Arterial line placed for close hemodynamic monitoring.  Patient emergently evaluated by both neurology and neurosurgery at the bedside.  Neurosurgery placed intraventricular drain.  Mannitol and hypertonic saline administered in  the ED, per neurology recommendations.  Will plan to admit the patient to the neuro ICU with neurology.  Patient's presentation is most consistent with acute presentation with potential threat to life or bodily function.         Final Clinical Impression(s) / ED Diagnoses Final diagnoses:  Nontraumatic intracerebral hemorrhage, unspecified cerebral location, unspecified laterality [facility])    Rx / DC Orders ED Discharge Orders     None         Rolla Flatten, MD 10/02/23 1555    Glendora Score, MD 10/02/23 [phone]

## 2023-10-02 NOTE — Progress Notes (Signed)
Patient's SBP dropped slightly below goal of 130-150 around 2245 with SBP in 120s. On call neurosx MD, Dr. Jake Samples made aware. MD is ok with SBP above 100.   Roxy Horseman, RN

## 2023-10-02 NOTE — Consult Note (Signed)
   Providing Compassionate, Quality Care - Together  Neurosurgery Consult  Referring physician: EDMD Reason for referral: Intracranial hemorrhage  Chief Complaint: Altered mental status  History of Present Illness: This is a 57 year old male, right-handed, who presents with altered mental status and a code stroke.  Upon workup he was found to have a very large left basal ganglia hemorrhage with significant extension into the intraventricular system.  He was intubated for airway protection as his GCS was less than 8.  Per his sister, he does not have any significant past medical history, has refused to go to the doctor for many years. States he was complaining of some back pain yesterday.  Medications: I have reviewed the patient's current medications. Allergies: No Known Allergies  History reviewed. No pertinent family history. Social History:  has no history on file for tobacco use, alcohol use, and drug use.  ROS: Unable to obtain  Physical Exam:  Vital signs in last 24 hours: Temp:  [98 F (36.7 C)-98.3 F (36.8 C)] 98 F (36.7 C) (07/25 1814) Pulse Rate:  [58-128] 65 (07/26 0746) Resp:  [11-18] 14 (07/26 0217) BP: (138-182)/(65-125) 153/88 (07/26 0700) SpO2:  [91 %-98 %] 96 % (07/26 0746) PE: Intubated, sedated Eyes closed, gaze midline Pupils equal, pinpoint, minimally reactive Positive cough, positive corneal reflex bilaterally No motor response to deep stimulus bilaterally upper/lower extremities  CT brain and CT angio head and neck evaluated.  Very large left basal ganglia centered intraparenchymal hemorrhage, with significant extension into the ventricular system with evidence of transependymal flow.  There is significant mass effect and rightward midline shift due to the hemorrhage.  Volume is approximately 97 mL.  There is basal cistern effacement.  Impression/Assessment:  57 year old male with  Plan:  Large left basal ganglia hemorrhage, with intraventricular  extension  -ICH score 4 -EVD placed, open to drain at 10 mmHg -I had extensive discussion with the sister who is the only family, I explained the significance of his hemorrhagic stroke.  I discussed the mortality of this size of stroke.  I answered all of her questions.  Emergent consent was obtained for placement of right frontal external ventricular drain verbally.   Thank you for allowing me to participate in this patient's care.  Please do not hesitate to call with questions or concerns.   Monia Pouch, DO Neurosurgeon [facility] [phone]

## 2023-10-02 NOTE — Progress Notes (Signed)
Transported pt from EDTRA B to 4N25 with bedside RN. Vitals are stable.

## 2023-10-02 NOTE — ED Notes (Signed)
Family at bedside. 

## 2023-10-02 NOTE — ED Notes (Signed)
ED TO INPATIENT HANDOFF REPORT  ED Nurse Name and Phone #: [phone]  S Name/Age/Gender Ryan Silva 57 y.o. male Room/Bed: TRABC/TRABC  Code Status   Code Status: Full Code  Home/SNF/Other Home  Is this baseline? No   Triage Complete: Triage complete  Chief Complaint IVH (intraventricular hemorrhage) (HCC) [I61.5]  Triage Note Pt bib ems from home; found down beside bed by wife; LSN 4pm yesterday; posturing, gcs 3, ems reports BP 300 palp; wife reports pt c/o back pain yesterday; ems reports pt does not go to doctor, no medical history; pupils pinpoint, pt given 2 mg narcan PTA with no change ; pt foaming at mouth. L gaze, bit lip; HR 140 sinus; wife endorses pt used marijuana in the past but no other reported drug use; ; ems assisting ventilations, reports kussmaul respirations   Allergies No Known Allergies  Level of Care/Admitting Diagnosis ED Disposition     ED Disposition  Admit   Condition  --   Comment  Hospital Area: [facility] [[id number]]  Level of Care: ICU [6]  May admit patient to [facility] or [facility] if equivalent level of care is available:: No  Covid Evaluation: Asymptomatic - no recent exposure (last 10 days) testing not required  Diagnosis: IVH (intraventricular hemorrhage) [facility]) [[id number]]  Admitting Physician: Cheri Fowler [[id number]]  Attending Physician: Cheri Fowler [[id number]]  Certification:: I certify this patient will need inpatient services for at least 2 midnights  Expected Medical Readiness: 10/05/2023          B Medical/Surgery History No past medical history on file. No past surgical history on file.   A IV Location/Drains/Wounds Patient Lines/Drains/Airways Status     Active Line/Drains/Airways     Name Placement date Placement time Site Days   Arterial Line 10/02/23 Left Radial 10/02/23  1100  Radial  less than 1   Peripheral IV 10/02/23 18 G Anterior;Right Forearm 10/02/23  0936  Forearm  less than 1    Peripheral IV 10/02/23 20 G Right;Posterior Hand 10/02/23  1002  Hand  less than 1   Peripheral IV 10/02/23 20 G Anterior;Left;Proximal Forearm 10/02/23  --  Forearm  less than 1   Peripheral IV 10/02/23 20 G Right;Posterior Hand 10/02/23  1056  Hand  less than 1   Airway 7.5 mm 10/02/23  0930  -- less than 1            Intake/Output Last 24 hours No intake or output data in the 24 hours ending 10/02/23 1151  Labs/Imaging Results for orders placed or performed during the hospital encounter of 10/02/23 (from the past 48 hour(s))  Comprehensive metabolic panel     Status: Abnormal   Collection Time: 10/02/23  9:32 AM  Result Value Ref Range   Sodium 141 135 - 145 mmol/L   Potassium 3.7 3.5 - 5.1 mmol/L   Chloride 100 98 - 111 mmol/L   CO2 13 (L) 22 - 32 mmol/L   Glucose, Bld 178 (H) 70 - 99 mg/dL    Comment: Glucose reference range applies only to samples taken after fasting for at least 8 hours.   BUN 15 6 - 20 mg/dL   Creatinine, Ser 2.13 (H) 0.61 - 1.24 mg/dL   Calcium 9.9 8.9 - 08.6 mg/dL   Total Protein 8.9 (H) 6.5 - 8.1 g/dL   Albumin 4.1 3.5 - 5.0 g/dL   AST 578 (H) 15 - 41 U/L   ALT 76 (H) 0 - 44 U/L  Alkaline Phosphatase 113 38 - 126 U/L   Total Bilirubin 1.1 <1.2 mg/dL   GFR, Estimated 43 (L) >60 mL/min    Comment: (NOTE) Calculated using the CKD-EPI Creatinine Equation (2021)    Anion gap 28 (H) 5 - 15    Comment: ELECTROLYTES REPEATED TO VERIFY Performed at [facility], [address] [place], [place] [address]   CBC with Differential     Status: Abnormal   Collection Time: 10/02/23  9:32 AM  Result Value Ref Range   WBC 20.1 (H) 4.0 - 10.5 K/uL   RBC 6.12 (H) 4.22 - 5.81 MIL/uL   Hemoglobin 18.9 (H) 13.0 - 17.0 g/dL   HCT 47.8 (H) 29.5 - 62.1 %   MCV 93.1 80.0 - 100.0 fL   MCH 30.9 26.0 - 34.0 pg   MCHC 33.2 30.0 - 36.0 g/dL   RDW 30.8 65.7 - 84.6 %   Platelets 165 150 - 400 K/uL   nRBC 0.0 0.0 - 0.2 %   Neutrophils Relative % 59 %   Neutro  Abs 11.9 (H) 1.7 - 7.7 K/uL   Lymphocytes Relative 32 %   Lymphs Abs 6.4 (H) 0.7 - 4.0 K/uL   Monocytes Relative 6 %   Monocytes Absolute 1.2 (H) 0.1 - 1.0 K/uL   Eosinophils Relative 1 %   Eosinophils Absolute 0.2 0.0 - 0.5 K/uL   Basophils Relative 0 %   Basophils Absolute 0.0 0.0 - 0.1 K/uL   nRBC 1 (H) 0 /100 WBC   Metamyelocytes Relative 1 %   Myelocytes 1 %   Abs Immature Granulocytes 0.40 (H) 0.00 - 0.07 K/uL    Comment: Performed at [facility], [address], [place], [place] [address]  Troponin I (High Sensitivity)     Status: Abnormal   Collection Time: 10/02/23  9:32 AM  Result Value Ref Range   Troponin I (High Sensitivity) 82 (H) <18 ng/L    Comment: (NOTE) Elevated high sensitivity troponin I (hsTnI) values and significant  changes across serial measurements may suggest ACS but many other  chronic and acute conditions are known to elevate hsTnI results.  Refer to the "Links" section for chest pain algorithms and additional  guidance. Performed at [facility], [address] [place], [place] [address]   I-stat chem 8, ED (not at [facility], [facility] or [facility])     Status: Abnormal   Collection Time: 10/02/23 10:10 AM  Result Value Ref Range   Sodium 140 135 - 145 mmol/L   Potassium 3.6 3.5 - 5.1 mmol/L   Chloride 106 98 - 111 mmol/L   BUN 17 6 - 20 mg/dL   Creatinine, Ser 2.44 (H) 0.61 - 1.24 mg/dL   Glucose, Bld 010 (H) 70 - 99 mg/dL    Comment: Glucose reference range applies only to samples taken after fasting for at least 8 hours.   Calcium, Ion 0.99 (L) 1.15 - 1.40 mmol/L   TCO2 17 (L) 22 - 32 mmol/L   Hemoglobin 19.7 (H) 13.0 - 17.0 g/dL   HCT 27.2 (H) 53.6 - 64.4 %  I-Stat venous blood gas, ([facility], [facility], [facility])     Status: Abnormal   Collection Time: 10/02/23 10:10 AM  Result Value Ref Range   pH, Ven 7.217 (L) 7.25 - 7.43   pCO2, Ven 39.4 (L) 44 - 60 mmHg   pO2, Ven 153 (H) 32 - 45 mmHg   Bicarbonate 16.0 (L) 20.0 - 28.0 mmol/L  TCO2 17 (L) 22 -  32 mmol/L   O2 Saturation 99 %   Acid-base deficit 11.0 (H) 0.0 - 2.0 mmol/L   Sodium 140 135 - 145 mmol/L   Potassium 3.7 3.5 - 5.1 mmol/L   Calcium, Ion 1.00 (L) 1.15 - 1.40 mmol/L   HCT 58.0 (H) 39.0 - 52.0 %   Hemoglobin 19.7 (H) 13.0 - 17.0 g/dL   Sample type VENOUS   I-Stat arterial blood gas, ED     Status: Abnormal   Collection Time: 10/02/23 11:29 AM  Result Value Ref Range   pH, Arterial 7.311 (L) 7.35 - 7.45   pCO2 arterial 35.5 32 - 48 mmHg   pO2, Arterial 503 (H) 83 - 108 mmHg   Bicarbonate 17.9 (L) 20.0 - 28.0 mmol/L   TCO2 19 (L) 22 - 32 mmol/L   O2 Saturation 100 %   Acid-base deficit 8.0 (H) 0.0 - 2.0 mmol/L   Sodium 129 (L) 135 - 145 mmol/L   Potassium 3.1 (L) 3.5 - 5.1 mmol/L   Calcium, Ion 1.02 (L) 1.15 - 1.40 mmol/L   HCT 38.0 (L) 39.0 - 52.0 %   Hemoglobin 12.9 (L) 13.0 - 17.0 g/dL   Patient temperature 19.1 F    Collection site Web designer by RT    Sample type ARTERIAL    CT ANGIO HEAD NECK W WO CM (CODE STROKE)  Result Date: 10/02/2023 CLINICAL DATA:  57 year old male code stroke presentation. Neurologic deficit. Chest pain. Sepsis. EXAM: CT ANGIOGRAPHY HEAD AND NECK WITH AND WITHOUT CONTRAST TECHNIQUE: Multidetector CT imaging of the head and neck was performed using the standard protocol during bolus administration of intravenous contrast. Multiplanar CT image reconstructions and MIPs were obtained to evaluate the vascular anatomy. Carotid stenosis measurements (when applicable) are obtained utilizing NASCET criteria, using the distal internal carotid diameter as the denominator. RADIATION DOSE REDUCTION: This exam was performed according to the departmental dose-optimization program which includes automated exposure control, adjustment of the mA and/or kV according to patient size and/or use of iterative reconstruction technique. CONTRAST:  OMNIPAQUE IOHEXOL 300 MG/ML  SOLN COMPARISON:  None Available. FINDINGS: CT HEAD Brain: Severe intracranial  hemorrhage. Large volume left hemisphere hyperdense intra-axial blood, epicenter at the left basal ganglia. Fairly extensive intraventricular and extra-axial blood extension. Estimated intra-axial blood volume is 97 mL (78 x 40 x 62 mm (AP by transverse by CC)). Large volume IVH. And moderate volume subarachnoid hemorrhage including in the basilar cisterns greater on the left, in the left sylvian fissure. There could be direct extension from the intra-axial blood to the subarachnoid space on series 2, image 16. Intracranial mass effect with effaced ventricles, rightward midline shift of 10 mm. Trapped occipital horns and right temporal horn. Transependymal edema. Effaced basilar cisterns. No definite tonsillar herniation. Brainstem seems hypodense on series 2, image 13, but might be artifactual due to the surrounding hyperdense blood. There is evidence of transependymal edema in the cerebral hemispheres, probably also advanced bilateral underlying white matter hypodensity. Calvarium and skull base: No skull fracture identified. Paranasal sinuses: Intubated. Well aerated paranasal sinuses, mastoids, tympanic cavities. Orbits: Disconjugate gaze. No discrete orbit or scalp soft tissue injury. Preliminary report of the above discussed by telephone with Dr. Derry Lory on 10/02/2023 at 0958 hours CTA NECK Skeleton: No acute osseous abnormality identified. Upper chest: Intubated. Endotracheal tube tip terminates above the carina. Small to moderate volume retained secretions in the lower trachea and carina but overall the visible major  airways are patent. Relatively mild dependent atelectasis in the visible lungs. Central pulmonary arteries are enhancing and patent. No superior mediastinal mass or lymphadenopathy. Other neck: Intubated with fluid in the pharynx. Otherwise negative. Aortic arch: 3 vessel arch. Mild arch atherosclerosis. Partially visible tortuosity of the descending thoracic aorta, up to 34 mm diameter.  Right carotid system: Brachiocephalic artery and right CCA origin are normal. Negative right CCA. Patent right carotid bifurcation with minimal irregularity. Patent right ICA to the skull base with mild tortuosity, no plaque or stenosis. Left carotid system: Similar mild tortuosity with no CCA plaque. Mild soft and calcified plaque at the left ICA origin. But bulky soft and calcified plaque at the distal left ICA bulb as seen on series 7, image 100 and series 12, image 33. Subsequent stenosis there numerically estimated at 65 % with respect to the distal vessel. Left ICA remains patent to the skull base. Vertebral arteries: Proximal right subclavian artery is patent with no significant plaque or stenosis. However, there is moderate to severe soft plaque and stenosis at the right vertebral artery origin on series 8, image 148. Right vertebral artery remains patent with mild additional V1 irregularity. Right vertebral artery remains enhancing to the skull base although with diminishing contrast density compared to the left side. See intracranial findings below. Proximal left subclavian artery and left vertebral artery origin are normal. Codominant left vertebral artery is enhancing and appears normal to the skull base. CTA HEAD Posterior circulation: Attenuated bilateral distal vertebral arteries, but earlier loss of enhancement in the right V4 segment as seen on series 5, image 153. Distal to the patent left PICA the left vertebral is irregular and stenotic. The vertebrobasilar junction is stenotic. The basilar artery is stenotic and irregular. But enhancement continues to the basilar tip as seen on series 11, image 33. SCA and PCA origins are patent. But irregular. There is symmetric bilateral PCA branch enhancement which is irregular. Anterior circulation: Right ICA siphon is patent to the terminus with calcified siphon atherosclerosis. Mild to moderate right siphon stenosis. Contralateral left ICA siphon is  enhancing but asymmetrically attenuated especially in the supraclinoid segment which appears moderately to severely stenotic series 9, image 147. Underlying moderate left siphon plaque elsewhere. MCA and ACA origins are patent but irregular (series 11, image 35). ACA branch enhancement is moderately attenuated but is maintained. See series 12, image 30. Bilateral MCA branch enhance mint similarly maintained but attenuated in a fairly symmetric pattern (series 10, image 20). There is mass effect on the left MCA branches from the intra-axial hemorrhage. No CTA spot sign. No intracranial aneurysm is identified. Posterior communicating arteries are diminutive or absent. Venous sinuses: Not evaluated due to early contrast timing. Anatomic variants: None significant. Review of the MIP images confirms the above findings IMPRESSION: 1. Severe Intracranial Hemorrhage: Large volume Left Basal ganglia bleed (97 mL) with extensive IVH and SAH. Intracranial mass effect with rightward midline shift of 10 mm. Combined trapped and effaced ventricles. Transependymal edema. Effaced basilar cisterns. This was discussed with Dr.  Derry Lory on 10/02/2023 at 0958 hours. 2. Intracranial CTA is remarkable for significantly attenuated - but not lost - enhancement of all anterior and posterior circulation branches. Differential considerations include diffuse intracranial vasospasm versus increasing intracranial pressure. This was communicated to Dr. Derry Lory at 1007 hours via the [facility] messaging system. 3. Negative for CTA spot sign.  No intracranial aneurysm identified. Positive for age advanced atherosclerosis with hemodynamically significant stenoses: - left ICA siphon moderate stenosis. -  left ICA bulb 65% stenosis. - right ICA siphon mild to moderate stenosis. - Severe stenosis of the Right Vertebral Artery origin. 4. Satisfactory endotracheal tube position. CTA chest abdomen pelvis reported separately. Electronically Signed   By: Odessa Fleming M.D.   On: 10/02/2023 10:33   CT CHEST ABDOMEN PELVIS WO CONTRAST  Result Date: 10/02/2023 CLINICAL DATA:  57 year old male code stroke presentation, intracranial hemorrhage. Chest pain. Sepsis. EXAM: CT CHEST, ABDOMEN AND PELVIS WITHOUT CONTRAST CT ANGIOGRAPHY CHEST, ABDOMEN AND PELVIS TECHNIQUE: Non-contrast CT of the chest abdomen and pelvis was initially obtained at 0950 hours. Multidetector CT imaging through the chest, abdomen and pelvis was performed at 1006 hours using the standard protocol during bolus administration of intravenous contrast. Multiplanar reconstructed images and MIPs were obtained and reviewed to evaluate the vascular anatomy. RADIATION DOSE REDUCTION: This exam was performed according to the departmental dose-optimization program which includes automated exposure control, adjustment of the mA and/or kV according to patient size and/or use of iterative reconstruction technique. CONTRAST:  75mL OMNIPAQUE IOHEXOL 300 MG/ML  SOLN COMPARISON:  CTA head and neck today reported separately. FINDINGS: CT/CTA CHEST FINDINGS Cardiovascular: Heart size within normal limits. No pericardial effusion. Some calcified coronary artery plaque prior to contrast series 3, image 34. Mild tortuosity and atherosclerosis of the thoracic aorta. Negative for thoracic aortic dissection or aneurysm. Central pulmonary artery enhancement better demonstrated on the neck CTA reported separately. Mediastinum/Nodes: Negative. No mediastinal mass or lymphadenopathy. Lungs/Pleura: Intubated on both imaging sets. Unchanged mild to moderate dependent secretions in the lower trachea and at the carina. Major airways remain patent. Dependent and enhancing atelectasis in the lower lobes appears unchanged on the pre versus postcontrast images. No pleural effusion, pneumothorax, or consolidation. Musculoskeletal: No displaced rib fracture identified. Thoracic spine, visible shoulder osseous structures and sternum appear  intact. Review of the MIP images confirms the above findings. CT/CTA ABDOMEN AND PELVIS FINDINGS VASCULAR Aortoiliac calcified atherosclerosis. Negative for abdominal aortic aneurysm or dissection. Major arterial structures in the abdomen and pelvis are patent. Portal venous system not evaluated. Review of the MIP images confirms the above findings. NON-VASCULAR Hepatobiliary: Negative noncontrast and early postcontrast appearance of the liver and gallbladder. Pancreas: Negative. Spleen: Negative. Adrenals/Urinary Tract: Normal adrenal glands. Nonobstructed kidneys with symmetric renal enhancement and contrast excretion. Excreted contrast in the bladder on the 2nd imaging set. Stomach/Bowel: Mostly decompressed large and small bowel. Negative retrocecal appendix. Decompressed stomach and duodenum. No free air or free fluid. Somewhat indistinct appearance of the right colon on both sets of images but no convincing mesenteric inflammation. Lymphatic: No evidence of lymphadenopathy. Reproductive: Negative. Other: No pelvis free fluid. Musculoskeletal: No acute osseous abnormality identified. Review of the MIP images confirms the above findings. IMPRESSION: 1. Mildly tortuous aorta with Aortic Atherosclerosis (ICD10-I70.0). Negative for Aortic Dissection or Aneurysm. 2. Intubated with mild dependent atelectasis. 3. No other acute or inflammatory process identified in the chest, abdomen, or pelvis on noncontrast CT or subsequent CTA. Electronically Signed   By: Odessa Fleming M.D.   On: 10/02/2023 10:32   CT Angio Chest/Abd/Pel for Dissection W and/or Wo Contrast  Result Date: 10/02/2023 CLINICAL DATA:  57 year old male code stroke presentation, intracranial hemorrhage. Chest pain. Sepsis. EXAM: CT CHEST, ABDOMEN AND PELVIS WITHOUT CONTRAST CT ANGIOGRAPHY CHEST, ABDOMEN AND PELVIS TECHNIQUE: Non-contrast CT of the chest abdomen and pelvis was initially obtained at 0950 hours. Multidetector CT imaging through the chest,  abdomen and pelvis was performed at 1006 hours using  the standard protocol during bolus administration of intravenous contrast. Multiplanar reconstructed images and MIPs were obtained and reviewed to evaluate the vascular anatomy. RADIATION DOSE REDUCTION: This exam was performed according to the departmental dose-optimization program which includes automated exposure control, adjustment of the mA and/or kV according to patient size and/or use of iterative reconstruction technique. CONTRAST:  75mL OMNIPAQUE IOHEXOL 300 MG/ML  SOLN COMPARISON:  CTA head and neck today reported separately. FINDINGS: CT/CTA CHEST FINDINGS Cardiovascular: Heart size within normal limits. No pericardial effusion. Some calcified coronary artery plaque prior to contrast series 3, image 34. Mild tortuosity and atherosclerosis of the thoracic aorta. Negative for thoracic aortic dissection or aneurysm. Central pulmonary artery enhancement better demonstrated on the neck CTA reported separately. Mediastinum/Nodes: Negative. No mediastinal mass or lymphadenopathy. Lungs/Pleura: Intubated on both imaging sets. Unchanged mild to moderate dependent secretions in the lower trachea and at the carina. Major airways remain patent. Dependent and enhancing atelectasis in the lower lobes appears unchanged on the pre versus postcontrast images. No pleural effusion, pneumothorax, or consolidation. Musculoskeletal: No displaced rib fracture identified. Thoracic spine, visible shoulder osseous structures and sternum appear intact. Review of the MIP images confirms the above findings. CT/CTA ABDOMEN AND PELVIS FINDINGS VASCULAR Aortoiliac calcified atherosclerosis. Negative for abdominal aortic aneurysm or dissection. Major arterial structures in the abdomen and pelvis are patent. Portal venous system not evaluated. Review of the MIP images confirms the above findings. NON-VASCULAR Hepatobiliary: Negative noncontrast and early postcontrast appearance of the  liver and gallbladder. Pancreas: Negative. Spleen: Negative. Adrenals/Urinary Tract: Normal adrenal glands. Nonobstructed kidneys with symmetric renal enhancement and contrast excretion. Excreted contrast in the bladder on the 2nd imaging set. Stomach/Bowel: Mostly decompressed large and small bowel. Negative retrocecal appendix. Decompressed stomach and duodenum. No free air or free fluid. Somewhat indistinct appearance of the right colon on both sets of images but no convincing mesenteric inflammation. Lymphatic: No evidence of lymphadenopathy. Reproductive: Negative. Other: No pelvis free fluid. Musculoskeletal: No acute osseous abnormality identified. Review of the MIP images confirms the above findings. IMPRESSION: 1. Mildly tortuous aorta with Aortic Atherosclerosis (ICD10-I70.0). Negative for Aortic Dissection or Aneurysm. 2. Intubated with mild dependent atelectasis. 3. No other acute or inflammatory process identified in the chest, abdomen, or pelvis on noncontrast CT or subsequent CTA. Electronically Signed   By: Odessa Fleming M.D.   On: 10/02/2023 10:32    Pending Labs Unresulted Labs (From admission, onward)     Start     Ordered   10/03/23 0500  Triglycerides  (propofol (DIPRIVAN))  Every 72 hours,   R     Comments: While on propofol (DIPRIVAN)    10/02/23 0931   10/02/23 1133  Hemoglobin A1c  (Glycemic Control (SSI)  Q 4 Hours / Glycemic Control (SSI)  AC +/- HS)  Once,   R       Comments: To assess prior glycemic control    10/02/23 1133   10/02/23 1128  HIV Antibody (routine testing w rflx)  (HIV Antibody (Routine testing w reflex) panel)  Once,   R        10/02/23 1133   10/02/23 1016  CK  Once,   URGENT        10/02/23 1016   10/02/23 1012  Sodium  Now then every 6 hours,   R (with STAT occurrences)      10/02/23 1012   10/02/23 0932  Rapid urine drug screen (hospital performed)  ONCE - STAT,   STAT  10/02/23 0932   10/02/23 0932  Urinalysis, Routine w reflex microscopic  -Urine, Catheterized  Once,   URGENT       Question:  Specimen Source  Answer:  Urine, Catheterized   10/02/23 0932            Vitals/Pain Today's Vitals   10/02/23 1115 10/02/23 1118 10/02/23 1120 10/02/23 1125  BP: 111/69 109/67 108/67 109/67  Pulse: 74 73 72 73  Resp: 18 18 18 18   Temp:    98.8 F (37.1 C)  SpO2: 100% 100% 100% 100%  Weight:      Height:        Isolation Precautions No active isolations  Medications Medications  propofol (DIPRIVAN) 1000 MG/100ML infusion (60 mcg/kg/min  70 kg Intravenous Rate/Dose Change 10/02/23 1148)  fentaNYL (SUBLIMAZE) injection 50 mcg (50 mcg Intravenous Not Given 10/02/23 1117)  fentaNYL in NS (96mcg/ml) infusion-PREMIX (125 mcg/hr Intravenous New Bag/Given 10/02/23 1044)  fentaNYL (SUBLIMAZE) bolus via infusion 50-100 mcg (50 mcg Intravenous Bolus from Bag 10/02/23 0935)  labetalol (NORMODYNE) injection 20 mg (20 mg Intravenous Given 10/02/23 0947)    And  clevidipine (CLEVIPREX) infusion 0.5 mg/mL (4 mg/hr Intravenous Rate/Dose Change 10/02/23 1000)   stroke: early stages of recovery book (has no administration in time range)  acetaminophen (TYLENOL) tablet 650 mg (has no administration in time range)    Or  acetaminophen (TYLENOL) 160 MG/5ML solution 650 mg (has no administration in time range)    Or  acetaminophen (TYLENOL) suppository 650 mg (has no administration in time range)  senna-docusate (Senokot-S) tablet 1 tablet (has no administration in time range)  pantoprazole (PROTONIX) injection 40 mg (has no administration in time range)  sodium chloride (hypertonic) 3 % solution ( Intravenous New Bag/Given 10/02/23 1111)  ceFAZolin (ANCEF) IVPB 2g/100 mL premix (0 g Intravenous Stopped 10/02/23 1147)  etomidate (AMIDATE) injection (20 mg Intravenous Given 10/02/23 0925)  naloxone (NARCAN) injection (2 mg Intravenous Given 10/02/23 0926)  succinylcholine (ANECTINE) injection (140 mg Intravenous Given  10/02/23 0925)  docusate sodium (COLACE) capsule 100 mg (has no administration in time range)  polyethylene glycol (MIRALAX / GLYCOLAX) packet 17 g (has no administration in time range)  0.9 %  sodium chloride infusion (has no administration in time range)  levETIRAcetam (KEPPRA) IVPB 500 mg/100 mL premix (has no administration in time range)  sodium chloride 3% (hypertonic) IV bolus 250 mL (0 mLs Intravenous Stopped 10/02/23 1113)  mannitol 20 % infusion 75 g (0 g Intravenous Stopped 10/02/23 1129)  iohexol (OMNIPAQUE) 300 MG/ML solution 75 mL (75 mLs Intravenous Contrast Given 10/02/23 1014)  iohexol (OMNIPAQUE) 300 MG/ML solution 100 mL (100 mLs Intravenous Contrast Given 10/02/23 1018)  labetalol (NORMODYNE) injection 20 mg (20 mg Intravenous Given 10/02/23 1008)    Mobility non-ambulatory     Focused Assessments    R Recommendations: See Admitting Provider Note  Report given to:   Additional Notes:

## 2023-10-02 NOTE — Progress Notes (Signed)
Patient transported from ED trauma B to CT and back to Trauma B with RT, RN, DO and Pharmacist. No complications noted.

## 2023-10-02 NOTE — ED Triage Notes (Signed)
Pt bib ems from home; found down beside bed by wife; LSN 4pm yesterday; posturing, gcs 3, ems reports BP 300 palp; wife reports pt c/o back pain yesterday; ems reports pt does not go to doctor, no medical history; pupils pinpoint, pt given 2 mg narcan PTA with no change ; pt foaming at mouth. L gaze, bit lip; HR 140 sinus; wife endorses pt used marijuana in the past but no other reported drug use; ; ems assisting ventilations, reports kussmaul respirations

## 2023-10-02 NOTE — Progress Notes (Signed)
SLP Cancellation Note  Patient Details Name: Ryan Silva MRN: [id number] DOB: 1965-12-25   Cancelled treatment:       Reason Eval/Treat Not Completed: Patient not medically ready. Order received for speech/language evaluation. Note that pt is intubated. SLP to f/u as able.    Mahala Menghini., M.A. CCC-SLP Acute Rehabilitation Services Office [phone]  Secure chat preferred  10/02/2023, 3:12 PM

## 2023-10-02 NOTE — Procedures (Signed)
   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 Kendell Bane C. Antone Summons, DO  ASSISTANT: None  ANESTHESIA: General Endotracheal  EBL: Minimal  SPECIMENS: None  DRAINS: Right frontal external ventricular drain  COMPLICATIONS: None  CONDITION: Hemodynamically stable, neuro critical condition  HISTORY: Ryan Silva is a 57 y.o. male that presented with a large left basal ganglia hemorrhage with significant intraventricular hemorrhage and hydrocephalus.  He had a poor neurologic exam, with a GCS of 3, therefore I recommended placement of right frontal ventricular drain for the hydrocephalus.  Verbal consent was obtained from the sister.  All risks, benefits and expected outcomes were discussed and agreed upon.  PROCEDURE IN DETAIL: The patient was evaluated in the emergency room, was already intubated and sedated. AThe patient was positioned on the gurneyin the supine position. All pressure points were meticulously padded.  Kocher oint skin incision was then marked out and prepped and draped in the usual sterile fashion. Physician driven timeout was performed.  5 cc of lidocaine were injected into the planned incision.  Using 15 blade, incision was made sharply over Kocher's point in the right frontal region down to the paracranium.  Paracranium was cleared with blunt dissection.  Self-retaining retractor was placed.  Using a twist hand drill, I created a twist drill bur hole.  Durotomy was performed with trocar.  I then placed the external ventricular drain to a depth of approximately 7 cm at the outer table of the cranium with positive bloody tinged CSF flow on the first pass.  Intracranial pressure was measured approximately at 30 mmHg.  This was then tunneled laterally and  connected to a close drainage system.  The wound was then closed with vertical mattress 3-0 Ethilon suture.  Sterile dressing was applied.  Patient tolerated the procedure well, there is no immediate complication.

## 2023-10-02 NOTE — ED Provider Notes (Signed)
.  Critical Care  Performed by: Glendora Score, MD Authorized by: Glendora Score, MD   Critical care provider statement:    Critical care time (minutes):  100   Critical care was time spent personally by me on the following activities:  Development of treatment plan with patient or surrogate, discussions with consultants, evaluation of patient's response to treatment, examination of patient, ordering and review of laboratory studies, ordering and review of radiographic studies, ordering and performing treatments and interventions, pulse oximetry, re-evaluation of patient's condition and review of old charts     Ryan Silva, Wyn Forster, MD 10/02/23 [phone]

## 2023-10-02 NOTE — Progress Notes (Signed)
RT transported pt on ventilator from 4N25 to CT and back without any complications. RN @ bedside.

## 2023-10-03 ENCOUNTER — Inpatient Hospital Stay (HOSPITAL_COMMUNITY): Payer: Commercial Managed Care - HMO

## 2023-10-03 DIAGNOSIS — G936 Cerebral edema: Secondary | ICD-10-CM | POA: Diagnosis not present

## 2023-10-03 DIAGNOSIS — I6389 Other cerebral infarction: Secondary | ICD-10-CM | POA: Diagnosis not present

## 2023-10-03 DIAGNOSIS — I610 Nontraumatic intracerebral hemorrhage in hemisphere, subcortical: Secondary | ICD-10-CM

## 2023-10-03 DIAGNOSIS — I161 Hypertensive emergency: Secondary | ICD-10-CM | POA: Diagnosis not present

## 2023-10-03 DIAGNOSIS — Z515 Encounter for palliative care: Secondary | ICD-10-CM | POA: Diagnosis not present

## 2023-10-03 DIAGNOSIS — R569 Unspecified convulsions: Secondary | ICD-10-CM

## 2023-10-03 DIAGNOSIS — I615 Nontraumatic intracerebral hemorrhage, intraventricular: Secondary | ICD-10-CM | POA: Diagnosis not present

## 2023-10-03 DIAGNOSIS — Z66 Do not resuscitate: Secondary | ICD-10-CM | POA: Diagnosis not present

## 2023-10-03 DIAGNOSIS — G935 Compression of brain: Secondary | ICD-10-CM | POA: Diagnosis not present

## 2023-10-03 LAB — ECHOCARDIOGRAM COMPLETE
AR max vel: 1.93 cm2
AV Area VTI: 2.1 cm2
AV Area mean vel: 1.95 cm2
AV Mean grad: 4 mm[Hg]
AV Peak grad: 7.8 mm[Hg]
Ao pk vel: 1.4 m/s
Area-P 1/2: 3.72 cm2
Calc EF: 65.5 %
Height: 70 in
S' Lateral: 2.2 cm
Single Plane A2C EF: 64.2 %
Single Plane A4C EF: 67.1 %
Weight: 2412.71 [oz_av]

## 2023-10-03 LAB — HEPATIC FUNCTION PANEL
ALT: 46 U/L — ABNORMAL HIGH (ref 0–44)
AST: 101 U/L — ABNORMAL HIGH (ref 15–41)
Albumin: 2.5 g/dL — ABNORMAL LOW (ref 3.5–5.0)
Alkaline Phosphatase: 49 U/L (ref 38–126)
Bilirubin, Direct: 0.3 mg/dL — ABNORMAL HIGH (ref 0.0–0.2)
Indirect Bilirubin: 0.4 mg/dL (ref 0.3–0.9)
Total Bilirubin: 0.7 mg/dL (ref <1.2)
Total Protein: 5.4 g/dL — ABNORMAL LOW (ref 6.5–8.1)

## 2023-10-03 LAB — LIPID PANEL
Cholesterol: 113 mg/dL (ref 0–200)
HDL: 20 mg/dL — ABNORMAL LOW (ref >40)
LDL Cholesterol: 69 mg/dL (ref 0–99)
Total CHOL/HDL Ratio: 5.7 {ratio}
Triglycerides: 122 mg/dL (ref <150)
VLDL: 24 mg/dL (ref 0–40)

## 2023-10-03 LAB — SODIUM
Sodium: 154 mmol/L — ABNORMAL HIGH (ref 135–145)
Sodium: 154 mmol/L — ABNORMAL HIGH (ref 135–145)
Sodium: 158 mmol/L — ABNORMAL HIGH (ref 135–145)

## 2023-10-03 LAB — CBC
HCT: 38.6 % — ABNORMAL LOW (ref 39.0–52.0)
Hemoglobin: 12.6 g/dL — ABNORMAL LOW (ref 13.0–17.0)
MCH: 30.8 pg (ref 26.0–34.0)
MCHC: 32.6 g/dL (ref 30.0–36.0)
MCV: 94.4 fL (ref 80.0–100.0)
Platelets: 101 10*3/uL — ABNORMAL LOW (ref 150–400)
RBC: 4.09 MIL/uL — ABNORMAL LOW (ref 4.22–5.81)
RDW: 13.8 % (ref 11.5–15.5)
WBC: 13.2 10*3/uL — ABNORMAL HIGH (ref 4.0–10.5)
nRBC: 0 % (ref 0.0–0.2)

## 2023-10-03 LAB — TROPONIN I (HIGH SENSITIVITY): Troponin I (High Sensitivity): 2344 ng/L (ref <18)

## 2023-10-03 LAB — PROTIME-INR
INR: 1.3 — ABNORMAL HIGH (ref 0.8–1.2)
Prothrombin Time: 16.2 s — ABNORMAL HIGH (ref 11.4–15.2)

## 2023-10-03 LAB — BASIC METABOLIC PANEL
Anion gap: 7 (ref 5–15)
BUN: 18 mg/dL (ref 6–20)
CO2: 21 mmol/L — ABNORMAL LOW (ref 22–32)
Calcium: 7.7 mg/dL — ABNORMAL LOW (ref 8.9–10.3)
Chloride: 124 mmol/L — ABNORMAL HIGH (ref 98–111)
Creatinine, Ser: 1.78 mg/dL — ABNORMAL HIGH (ref 0.61–1.24)
GFR, Estimated: 44 mL/min — ABNORMAL LOW (ref >60)
Glucose, Bld: 122 mg/dL — ABNORMAL HIGH (ref 70–99)
Potassium: 4 mmol/L (ref 3.5–5.1)
Sodium: 152 mmol/L — ABNORMAL HIGH (ref 135–145)

## 2023-10-03 LAB — MRSA NEXT GEN BY PCR, NASAL: MRSA by PCR Next Gen: NOT DETECTED

## 2023-10-03 LAB — TRIGLYCERIDES: Triglycerides: 122 mg/dL (ref <150)

## 2023-10-03 LAB — APTT: aPTT: 33 s (ref 24–36)

## 2023-10-03 MED ORDER — LORAZEPAM 2 MG/ML IJ SOLN
INTRAMUSCULAR | Status: AC
  Filled 2023-10-03: qty 1

## 2023-10-03 MED ORDER — LEVETIRACETAM IN NACL 1000 MG/100ML IV SOLN
1000.0000 mg | Freq: Once | INTRAVENOUS | Status: AC
  Administered 2023-10-03: 1000 mg via INTRAVENOUS
  Filled 2023-10-03: qty 100

## 2023-10-03 MED ORDER — LABETALOL HCL 5 MG/ML IV SOLN
10.0000 mg | INTRAVENOUS | Status: DC | PRN
  Administered 2023-10-03 – 2023-10-05 (×3): 10 mg via INTRAVENOUS
  Filled 2023-10-03 (×3): qty 4

## 2023-10-03 MED ORDER — LEVETIRACETAM IN NACL 1000 MG/100ML IV SOLN
1000.0000 mg | Freq: Two times a day (BID) | INTRAVENOUS | Status: DC
  Administered 2023-10-03 – 2023-10-09 (×13): 1000 mg via INTRAVENOUS
  Filled 2023-10-03 (×13): qty 100

## 2023-10-03 MED ORDER — LORAZEPAM 2 MG/ML IJ SOLN
1.0000 mg | Freq: Once | INTRAMUSCULAR | Status: AC
  Administered 2023-10-03: 1 mg via INTRAVENOUS

## 2023-10-03 MED ORDER — CHLORHEXIDINE GLUCONATE CLOTH 2 % EX PADS
6.0000 | MEDICATED_PAD | Freq: Every day | CUTANEOUS | Status: DC
  Administered 2023-10-03 – 2023-10-09 (×8): 6 via TOPICAL

## 2023-10-03 MED ORDER — PERFLUTREN LIPID MICROSPHERE
1.0000 mL | INTRAVENOUS | Status: DC | PRN
  Administered 2023-10-03: 3 mL via INTRAVENOUS

## 2023-10-03 MED ORDER — ORAL CARE MOUTH RINSE
15.0000 mL | OROMUCOSAL | Status: DC
  Administered 2023-10-03 – 2023-10-09 (×80): 15 mL via OROMUCOSAL

## 2023-10-03 MED ORDER — HEPARIN SODIUM (PORCINE) 5000 UNIT/ML IJ SOLN
5000.0000 [IU] | Freq: Three times a day (TID) | INTRAMUSCULAR | Status: DC
  Administered 2023-10-04 – 2023-10-07 (×11): 5000 [IU] via SUBCUTANEOUS
  Filled 2023-10-03 (×11): qty 1

## 2023-10-03 MED ORDER — ORAL CARE MOUTH RINSE: 15.0000 mL | OROMUCOSAL | Status: DC | PRN

## 2023-10-03 MED ORDER — CARVEDILOL 12.5 MG PO TABS
25.0000 mg | ORAL_TABLET | Freq: Two times a day (BID) | ORAL | Status: DC
  Administered 2023-10-03 – 2023-10-05 (×3): 25 mg
  Filled 2023-10-03 (×5): qty 2

## 2023-10-03 MED ORDER — AMLODIPINE BESYLATE 10 MG PO TABS
10.0000 mg | ORAL_TABLET | Freq: Every day | ORAL | Status: DC
  Administered 2023-10-03 – 2023-10-05 (×3): 10 mg
  Filled 2023-10-03 (×3): qty 1

## 2023-10-03 NOTE — Progress Notes (Signed)
eLink Physician-Brief Progress Note Patient Name: Ryan Silva DOB: 1966-04-25 MRN: [id number]   Date of Service  10/03/2023  HPI/Events of Note  Elevated troponin 2344 which has remained elevated since yesterday in the afternoon. EKG in am yesterday with some ischemic changes.  eICU Interventions  Repeat EKG Echo ordered for today     Intervention Category Intermediate Interventions: Other:  Ryan Silva, Demetrius Charity 10/03/2023, 6:26 AM

## 2023-10-03 NOTE — Progress Notes (Signed)
Echocardiogram 2D Echocardiogram has been performed.  Charleene Callegari N Mylo Driskill,RDCS 10/03/2023, 9:44 AM

## 2023-10-03 NOTE — Progress Notes (Signed)
NAME:  Ryan Silva, MRN:  [id number], DOB:  [birth year], LOS: 1 ADMISSION DATE:  10/02/2023, CONSULTATION DATE: 10/02/2023 REFERRING MD:  Rolla Flatten, MD , CHIEF COMPLAINT: Unresponsiveness  History of Present Illness:  57 year old male who was brought into the emergency department after he was found down by the bedside by his wife.  His last known well was 4 PM yesterday, upon arrival to ED patient was noted to be posturing, his GCS was 3 with systolic blood pressure ranging in 300s.  He was noted to have pinpoint pupils, he was given Narcan without much improvement, he was noted to vomiting from the mouth with a left gaze, he was immediately intubated.  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 CTA abdomen/pelvis/head and neck showed no aneurysm or aortic dissection. Patient was evaluated by neurosurgery, EVD was placed, evaluated by neurology, recommend admission to ICU  Pertinent  Medical History  Hypertension  Significant Hospital Events: Including procedures, antibiotic start and stop dates in addition to other pertinent events   11/22 EVD placed  Interim History / Subjective:  -Relative hypotension overnight, SBP to 120s (goal 130-150) -Noted to have some subtle isolated lip twitching overnight, EEG ordered Ativan given -ICP 10, question EVD draining adequately, clot -Persistent elevated troponin 2344 -Fentanyl 150, propofol 60  > 30 -Na 143  > 152  0.40, PEEP 5, 440 x 18 I/O+ 3 L total   Objective   Blood pressure 106/66, pulse 72, temperature 98.3 F (36.8 C), temperature source Axillary, resp. rate 18, height 5\' 10"  (1.778 m), weight 68.4 kg, SpO2 96%.    Vent Mode: PRVC FiO2 (%):  [40 %-100 %] 40 % Set Rate:  [10 bmp-18 bmp] 18 bmp Vt Set:  [440 mL-550 mL] 440 mL PEEP:  [5 cmH20] 5  cmH20 Plateau Pressure:  [11 cmH20-16 cmH20] 11 cmH20   Intake/Output Summary (Last 24 hours) at 10/03/2023 0738 Last data filed at 10/03/2023 0700 Gross per 24 hour  Intake 4431.16 ml  Output 1335 ml  Net 3096.16 ml   Filed Weights   10/02/23 0900 10/03/23 0500  Weight: 70 kg 68.4 kg    Examination: General: Crtitically ill-appearing male, orally intubated HEENT: Pinpoint pupils and unreactive.  ET tube in place.  EVD in place Neuro: Does not open eyes to voice, stimulation.  His pupils are pinpoint and not reactive.  Extensor posturing to pain. Chest: Clear bilaterally without wheezes or crackles Heart: Regular, no murmur Abdomen: Nondistended with positive bowel sounds Skin: No rash   Resolved Hospital Problem list   As above  Assessment & Plan:  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, ICH score of 4, with expected mortality 97% Severe cerebral edema with brain compression and midline shift Brain herniation syndrome Hypertensive emergency Acute obstructive hydrocephalus Possible seizure activity 11/23 Acute respiratory failure requiring mechanical ventilation due to the above Hypernatremia  Plans: -Appreciate neurosurgery evaluation and management -EVD in place, following output.  May need to be flushed this morning 11/23 -MRI brain pending -Follow EEG results -Echocardiogram results pending -SBP goal 100-150 per updated neurosurgery goals overnight, not currently on Cleviprex but available -Continue hypertonic saline, currently 75 cc/h.  Follow BMP and sodium.  Would accept Na 150-155 -All anticoagulation held -Will need to discuss goals of care.  Unfortunately prognosis for meaningful survival here is poor.  Will discuss with family,  neurosurgery.   Guarded prognosis   Best Practice (right click and "Reselect all SmartList Selections" daily)   Diet/type: NPO DVT prophylaxis: SCDs Start: 10/02/23 1129    Pressure ulcer(s): not present on admission  GI prophylaxis: PPI Lines: N/A Foley:  Yes, and it is still needed Code Status:  full code Last date of multidisciplinary goals of care discussion [pending]  Labs   CBC: Recent Labs  Lab 10/02/23 0932 10/02/23 1010 10/02/23 1129 10/03/23 0432  WBC 20.1*  --   --  13.2*  NEUTROABS 11.9*  --   --   --   HGB 18.9* 19.7*  19.7* 12.9* 12.6*  HCT 57.0* 58.0*  58.0* 38.0* 38.6*  MCV 93.1  --   --  94.4  PLT 165  --   --  101*    Basic Metabolic Panel: Recent Labs  Lab 10/02/23 0932 10/02/23 1010 10/02/23 1129 10/02/23 1612 10/02/23 1810 10/03/23 0432  NA 141 140  140 129* 139 143 152*  K 3.7 3.6  3.7 3.1*  --   --  4.0  CL 100 106  --   --   --  124*  CO2 13*  --   --   --   --  21*  GLUCOSE 178* 176*  --   --   --  122*  BUN 15 17  --   --   --  18  CREATININE 1.81* 1.40*  --   --   --  1.78*  CALCIUM 9.9  --   --   --   --  7.7*   GFR: Estimated Creatinine Clearance: 44.8 mL/min (A) (by C-G formula based on SCr of 1.78 mg/dL (H)). Recent Labs  Lab 10/02/23 0932 10/03/23 0432  WBC 20.1* 13.2*    Liver Function Tests: Recent Labs  Lab 10/02/23 0932 10/03/23 0432  AST 104* 101*  ALT 76* 46*  ALKPHOS 113 49  BILITOT 1.1 0.7  PROT 8.9* 5.4*  ALBUMIN 4.1 2.5*   No results for input(s): "LIPASE", "AMYLASE" in the last 168 hours. No results for input(s): "AMMONIA" in the last 168 hours.  ABG    Component Value Date/Time   PHART 7.311 (L) 10/02/2023 1129   PCO2ART 35.5 10/02/2023 1129   PO2ART 503 (H) 10/02/2023 1129   HCO3 17.9 (L) 10/02/2023 1129   TCO2 19 (L) 10/02/2023 1129   ACIDBASEDEF 8.0 (H) 10/02/2023 1129   O2SAT 100 10/02/2023 1129     Coagulation Profile: Recent Labs  Lab 10/03/23 0432  INR 1.3*    Cardiac Enzymes: Recent Labs  Lab 10/02/23 0932  CKTOTAL 179    HbA1C: Hgb A1c MFr Bld  Date/Time Value Ref Range Status  10/02/2023 09:32 AM 5.7 (H) 4.8 - 5.6 % Final    Comment:     (NOTE) Pre diabetes:          5.7%-6.4%  Diabetes:              >6.4%  Glycemic control for   <7.0% adults with diabetes     CBG: Recent Labs  Lab 10/02/23 1520  GLUCAP 169*     Critical care time: 34 min     The patient is critically ill due to acute intraparenchymal basal ganglia hemorrhage/cerebral edema/brain herniation.  Critical care was necessary to treat or prevent imminent or life-threatening deterioration.  Critical care was time spent personally by me on the following activities: development of treatment plan with patient and/or surrogate as well as nursing,  discussions with consultants, evaluation of patient's response to treatment, examination of patient, obtaining history from patient or surrogate, ordering and performing treatments and interventions, ordering and review of laboratory studies, ordering and review of radiographic studies, pulse oximetry, re-evaluation of patient's condition and participation in multidisciplinary rounds.   During this encounter critical care time was devoted to patient care services described in this note for minutes.    Levy Pupa, MD, PhD 10/03/2023, 7:38 AM [facility] [phone] or if no answer before 7:00PM call [phone] For any issues after 7:00PM please call eLink [phone]

## 2023-10-03 NOTE — Progress Notes (Signed)
EEG complete - results pending 

## 2023-10-03 NOTE — Progress Notes (Signed)
SLP Cancellation Note  Patient Details Name: [name] MRN: [id number] DOB: 12-22-1965   Cancelled treatment:       Reason Eval/Treat Not Completed: Patient not medically ready. New order received for speech/language evaluation. Pt remains on vent, minimally reactive. SLP to hold eval for now.     Mahala Menghini., M.A. CCC-SLP Acute Rehabilitation Services Office [phone]  Secure chat preferred  10/03/2023, 1:39 PM

## 2023-10-03 NOTE — Progress Notes (Signed)
Sodium from 2230 came back at 158. Dr. Amada Jupiter made aware. VO to turn off 3%.   Roxy Horseman, RN

## 2023-10-03 NOTE — Progress Notes (Signed)
Clot in EVD noted around 0145, no longer draining. Dr Dawley notified. No interventions needed per MD and neurosx will evaluate in the morning.   Roxy Horseman, RN

## 2023-10-03 NOTE — Procedures (Addendum)
Patient Name: Ryan Silva  MRN: [id number]  Epilepsy Attending: Charlsie Quest  Referring Physician/Provider: Rejeana Brock, MD  Date: 10/03/2023 Duration: 23.23 mins  Patient history: 57 yo m with lip twitching getting eeg to evaluate for  seizure  Level of alertness:  comatose  AEDs during EEG study: LEV, propofol  Technical aspects: This EEG study was done with scalp electrodes positioned according to the 10-20 International system of electrode placement. Electrical activity was reviewed with band pass filter of 1-70Hz , sensitivity of 7 uV/mm, display speed of 34mm/sec with a 60Hz  notched filter applied as appropriate. EEG data were recorded continuously and digitally stored.  Video monitoring was available and reviewed as appropriate.  Description:  EEG showed burst suppression with bursts of generalized polymorphic sharply contoured 3 to 6 Hz theta-delta slowing lasting 1-3 seconds alternating with generalized attenuation lasting 3-7 seconds. Hyperventilation and photic stimulation were not performed.      ABNORMALITY - Burst attenuation, generalized   IMPRESSION: This study is suggestive of severe to profound diffuse encephalopathy. No seizures or epileptiform discharges were seen throughout the recording.   Fredderick Swanger Annabelle Harman

## 2023-10-03 NOTE — Progress Notes (Signed)
   Providing Compassionate, Quality Care - Together  NEUROSURGERY PROGRESS NOTE   S: No issues overnight.  EVD was clotted for a moment of time but has now been draining  O: EXAM:  BP 117/73 (BP Location: Right Arm)   Pulse 75   Temp 97.9 F (36.6 C) (Oral)   Resp 11   Ht 5\' 10"  (1.778 m)   Wt 68.4 kg   SpO2 96%   BMI 21.64 kg/m   Intubated, sedated Eyes closed Pupils pinpoint, minimally reactive, gaze midline No motor response upper/lower extremities EVD open to drain at 10 mmHg, ICP at 2 mmHg -Blood-tinged CSF in EVD drainage bag  ASSESSMENT:  57 y.o. 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 -Guarded prognosis, neurologic exam remains poor -Recommend palliative evaluation    Thank you for allowing me to participate in this patient's care.  Please do not hesitate to call with questions or concerns.   Monia Pouch, DO Neurosurgeon [facility] [phone]

## 2023-10-03 NOTE — Progress Notes (Signed)
Date and time results received: 10/03/23 0545  Test: Troponin  Critical Value: 2344  Name of Provider Notified: Elink  Orders Received? Or Actions Taken?:  Elink RN aware and will pass along to MD.   Roxy Horseman, RN

## 2023-10-03 NOTE — Progress Notes (Signed)
Subtle isolated lip twiching. Will give ativan 1mg  x 1 and additional keppra dose. Will get EEG as well.  Ritta Slot, MD [facility] [phone]  If 7pm- 7am, please page neurology on call as listed in AMION.

## 2023-10-03 NOTE — Progress Notes (Signed)
STROKE TEAM PROGRESS NOTE   SUBJECTIVE (INTERVAL HISTORY) His wife and brother are at the bedside. Pt still intubated on vent, not responsive. EVD patent. Still on 3% saline. CT repeat stable hematoma and MLS at 1cm. Found to have intermittent eyelid twitching, increase keppra dose.    OBJECTIVE Temp:  [97.7 F (36.5 C)-98.7 F (37.1 C)] 97.9 F (36.6 C) (11/23 1130) Pulse Rate:  [60-88] 87 (11/23 1130) Cardiac Rhythm: Normal sinus rhythm (11/23 0800) Resp:  [9-18] 12 (11/23 1130) BP: (94-141)/(64-87) 138/77 (11/23 1100) SpO2:  [94 %-100 %] 96 % (11/23 1130) Arterial Line BP: (108-167)/(52-72) 139/63 (11/23 1130) FiO2 (%):  [40 %] 40 % (11/23 1108) Weight:  [68.4 kg] 68.4 kg (11/23 0500)  Recent Labs  Lab 10/02/23 1520  GLUCAP 169*   Recent Labs  Lab 10/02/23 0932 10/02/23 1010 10/02/23 1129 10/02/23 1612 10/02/23 1810 10/03/23 0432 10/03/23 0957  NA 141 140  140 129* 139 143 152* 154*  K 3.7 3.6  3.7 3.1*  --   --  4.0  --   CL 100 106  --   --   --  124*  --   CO2 13*  --   --   --   --  21*  --   GLUCOSE 178* 176*  --   --   --  122*  --   BUN 15 17  --   --   --  18  --   CREATININE 1.81* 1.40*  --   --   --  1.78*  --   CALCIUM 9.9  --   --   --   --  7.7*  --    Recent Labs  Lab 10/02/23 0932 10/03/23 0432  AST 104* 101*  ALT 76* 46*  ALKPHOS 113 49  BILITOT 1.1 0.7  PROT 8.9* 5.4*  ALBUMIN 4.1 2.5*   Recent Labs  Lab 10/02/23 0932 10/02/23 1010 10/02/23 1129 10/03/23 0432  WBC 20.1*  --   --  13.2*  NEUTROABS 11.9*  --   --   --   HGB 18.9* 19.7*  19.7* 12.9* 12.6*  HCT 57.0* 58.0*  58.0* 38.0* 38.6*  MCV 93.1  --   --  94.4  PLT 165  --   --  101*   Recent Labs  Lab 10/02/23 0932  CKTOTAL 179   Recent Labs    10/03/23 0432  LABPROT 16.2*  INR 1.3*   Recent Labs    10/02/23 1332  COLORURINE YELLOW  LABSPEC 1.028  PHURINE 6.0  GLUCOSEU 150*  HGBUR LARGE*  BILIRUBINUR NEGATIVE  KETONESUR NEGATIVE  PROTEINUR >=300*   NITRITE NEGATIVE  LEUKOCYTESUR NEGATIVE       Component Value Date/Time   CHOL 113 10/03/2023 0432   TRIG 122 10/03/2023 0432   TRIG 122 10/03/2023 0432   HDL 20 (L) 10/03/2023 0432   CHOLHDL 5.7 10/03/2023 0432   VLDL 24 10/03/2023 0432   LDLCALC 69 10/03/2023 0432   Lab Results  Component Value Date   HGBA1C 5.7 (H) 10/02/2023      Component Value Date/Time   LABOPIA NONE DETECTED 10/02/2023 1330   COCAINSCRNUR NONE DETECTED 10/02/2023 1330   LABBENZ NONE DETECTED 10/02/2023 1330   AMPHETMU NONE DETECTED 10/02/2023 1330   THCU POSITIVE (A) 10/02/2023 1330   LABBARB NONE DETECTED 10/02/2023 1330    No results for input(s): "ETH" in the last 168 hours.  I have personally reviewed the radiological images below and agree  with the radiology interpretations.  EEG adult  Result Date: 10/03/2023 Charlsie Quest, MD     10/03/2023  8:52 AM Patient Name: Ryan Silva MRN: [id number] Epilepsy Attending: Charlsie Quest Referring Physician/Provider: Rejeana Brock, MD Date: 10/03/2023 Duration: 23.23 mins Patient history: 57 yo m with lip twitching getting eeg to evaluate for  seizure Level of alertness:  comatose AEDs during EEG study: LEV, propofol Technical aspects: This EEG study was done with scalp electrodes positioned according to the 10-20 International system of electrode placement. Electrical activity was reviewed with band pass filter of 1-70Hz , sensitivity of 7 uV/mm, display speed of 85mm/sec with a 60Hz  notched filter applied as appropriate. EEG data were recorded continuously and digitally stored.  Video monitoring was available and reviewed as appropriate. Description:  EEG showed burst suppression with bursts of generalized polymorphic sharply contoured 3 to 6 Hz theta-delta slowing lasting 1-3 seconds alternating with generalized attenuation lasting 3-7 seconds. Hyperventilation and photic stimulation were not performed.    ABNORMALITY - Burst attenuation,  generalized  IMPRESSION: This study is suggestive of severe to profound diffuse encephalopathy. No seizures or epileptiform discharges were seen throughout the recording.  Priyanka Annabelle Harman   CT HEAD WO CONTRAST ( )  Result Date: 10/03/2023 CLINICAL DATA:  Stroke, follow-up EXAM: CT HEAD WITHOUT CONTRAST TECHNIQUE: Contiguous axial images were obtained from the base of the skull through the vertex without intravenous contrast. RADIATION DOSE REDUCTION: This exam was performed according to the departmental dose-optimization program which includes automated exposure control, adjustment of the mA and/or kV according to patient size and/or use of iterative reconstruction technique. COMPARISON:  10/02/2023 CT head FINDINGS: Brain: Redemonstrated large l intraparenchymal hematoma centered in the left basal ganglia, which measures up to 7.2 x 3.5 x 4.1 cm (AP x TR x CC) (series 6, image 47 and series 5, image 39), previously 7.9 x 3.9 x 5.4 cm when remeasured similarly. Increased diffusivity previously noted subarachnoid hemorrhage, with likely similar volume of intraventricular hemorrhage. Interval placement of a right frontal approach ventricular catheter, which terminates in the anterior body of the right lateral ventricle. Slightly decreased size the right lateral ventricle. 6 mm of left-to-right midline shift, previously 10 mm. The basilar cisterns remain effaced. No definite tonsillar herniation. Again noted is hypodensity in the pons. Likely transependymal edema. Vascular: No hyperdense vessel. Skull: Right frontal burr hole. Negative for acute fracture or suspicious osseous lesion. Sinuses/Orbits: Mucous retention cysts in the maxillary sinuses. Mucosal thickening in the ethmoid air cells. Redemonstrated dysconjugate gaze. No acute finding in the orbits. IMPRESSION: 1. Redemonstrated large intraparenchymal hematoma centered in the left basal ganglia, which measures up to 7.2 cm, slightly decreased from prior  exam 2. Interval placement of a right frontal approach ventricular catheter, which terminates in the anterior body of the right lateral ventricle. Slightly decreased size of the right lateral ventricle. 3. 6 mm of left-to-right midline shift, previously 10 mm. The basilar cisterns remain effaced. No definite herniation. Electronically Signed   By: Wiliam Ke M.D.   On: 10/03/2023 03:25   DG CHEST PORT 1 VIEW  Result Date: 10/02/2023 CLINICAL DATA:  Abnormal respiration. EXAM: PORTABLE CHEST 1 VIEW COMPARISON:  None Available. FINDINGS: The heart size and mediastinal contours are within normal limits. Endotracheal and nasogastric tubes are grossly good position. Both lungs are clear. The visualized skeletal structures are unremarkable. IMPRESSION: No active disease. Electronically Signed   By: Lupita Raider M.D.   On: 10/02/2023 12:30   DG  Abd 1 View  Result Date: 10/02/2023 CLINICAL DATA:  Altered mental status, abnormal respiration. EXAM: ABDOMEN - 1 VIEW COMPARISON:  None Available. FINDINGS: The bowel gas pattern is normal. Nasogastric tube tip is seen in proximal stomach. No radio-opaque calculi or other significant radiographic abnormality are seen. IMPRESSION: No abnormal bowel dilatation. Electronically Signed   By: Lupita Raider M.D.   On: 10/02/2023 12:29   CT ANGIO HEAD NECK W WO CM (CODE STROKE)  Result Date: 10/02/2023 CLINICAL DATA:  57 year old male code stroke presentation. Neurologic deficit. Chest pain. Sepsis. EXAM: CT ANGIOGRAPHY HEAD AND NECK WITH AND WITHOUT CONTRAST TECHNIQUE: Multidetector CT imaging of the head and neck was performed using the standard protocol during bolus administration of intravenous contrast. Multiplanar CT image reconstructions and MIPs were obtained to evaluate the vascular anatomy. Carotid stenosis measurements (when applicable) are obtained utilizing NASCET criteria, using the distal internal carotid diameter as the denominator. RADIATION DOSE  REDUCTION: This exam was performed according to the departmental dose-optimization program which includes automated exposure control, adjustment of the mA and/or kV according to patient size and/or use of iterative reconstruction technique. CONTRAST:  OMNIPAQUE IOHEXOL 300 MG/ML  SOLN COMPARISON:  None Available. FINDINGS: CT HEAD Brain: Severe intracranial hemorrhage. Large volume left hemisphere hyperdense intra-axial blood, epicenter at the left basal ganglia. Fairly extensive intraventricular and extra-axial blood extension. Estimated intra-axial blood volume is 97 mL (78 x 40 x 62 mm (AP by transverse by CC)). Large volume IVH. And moderate volume subarachnoid hemorrhage including in the basilar cisterns greater on the left, in the left sylvian fissure. There could be direct extension from the intra-axial blood to the subarachnoid space on series 2, image 16. Intracranial mass effect with effaced ventricles, rightward midline shift of 10 mm. Trapped occipital horns and right temporal horn. Transependymal edema. Effaced basilar cisterns. No definite tonsillar herniation. Brainstem seems hypodense on series 2, image 13, but might be artifactual due to the surrounding hyperdense blood. There is evidence of transependymal edema in the cerebral hemispheres, probably also advanced bilateral underlying white matter hypodensity. Calvarium and skull base: No skull fracture identified. Paranasal sinuses: Intubated. Well aerated paranasal sinuses, mastoids, tympanic cavities. Orbits: Disconjugate gaze. No discrete orbit or scalp soft tissue injury. Preliminary report of the above discussed by telephone with Dr. Derry Lory on 10/02/2023 at 0958 hours CTA NECK Skeleton: No acute osseous abnormality identified. Upper chest: Intubated. Endotracheal tube tip terminates above the carina. Small to moderate volume retained secretions in the lower trachea and carina but overall the visible major airways are patent.  Relatively mild dependent atelectasis in the visible lungs. Central pulmonary arteries are enhancing and patent. No superior mediastinal mass or lymphadenopathy. Other neck: Intubated with fluid in the pharynx. Otherwise negative. Aortic arch: 3 vessel arch. Mild arch atherosclerosis. Partially visible tortuosity of the descending thoracic aorta, up to 34 mm diameter. Right carotid system: Brachiocephalic artery and right CCA origin are normal. Negative right CCA. Patent right carotid bifurcation with minimal irregularity. Patent right ICA to the skull base with mild tortuosity, no plaque or stenosis. Left carotid system: Similar mild tortuosity with no CCA plaque. Mild soft and calcified plaque at the left ICA origin. But bulky soft and calcified plaque at the distal left ICA bulb as seen on series 7, image 100 and series 12, image 33. Subsequent stenosis there numerically estimated at 65 % with respect to the distal vessel. Left ICA remains patent to the skull base. Vertebral arteries: Proximal right subclavian artery  is patent with no significant plaque or stenosis. However, there is moderate to severe soft plaque and stenosis at the right vertebral artery origin on series 8, image 148. Right vertebral artery remains patent with mild additional V1 irregularity. Right vertebral artery remains enhancing to the skull base although with diminishing contrast density compared to the left side. See intracranial findings below. Proximal left subclavian artery and left vertebral artery origin are normal. Codominant left vertebral artery is enhancing and appears normal to the skull base. CTA HEAD Posterior circulation: Attenuated bilateral distal vertebral arteries, but earlier loss of enhancement in the right V4 segment as seen on series 5, image 153. Distal to the patent left PICA the left vertebral is irregular and stenotic. The vertebrobasilar junction is stenotic. The basilar artery is stenotic and irregular. But  enhancement continues to the basilar tip as seen on series 11, image 33. SCA and PCA origins are patent. But irregular. There is symmetric bilateral PCA branch enhancement which is irregular. Anterior circulation: Right ICA siphon is patent to the terminus with calcified siphon atherosclerosis. Mild to moderate right siphon stenosis. Contralateral left ICA siphon is enhancing but asymmetrically attenuated especially in the supraclinoid segment which appears moderately to severely stenotic series 9, image 147. Underlying moderate left siphon plaque elsewhere. MCA and ACA origins are patent but irregular (series 11, image 35). ACA branch enhancement is moderately attenuated but is maintained. See series 12, image 30. Bilateral MCA branch enhance mint similarly maintained but attenuated in a fairly symmetric pattern (series 10, image 20). There is mass effect on the left MCA branches from the intra-axial hemorrhage. No CTA spot sign. No intracranial aneurysm is identified. Posterior communicating arteries are diminutive or absent. Venous sinuses: Not evaluated due to early contrast timing. Anatomic variants: None significant. Review of the MIP images confirms the above findings IMPRESSION: 1. Severe Intracranial Hemorrhage: Large volume Left Basal ganglia bleed (97 mL) with extensive IVH and SAH. Intracranial mass effect with rightward midline shift of 10 mm. Combined trapped and effaced ventricles. Transependymal edema. Effaced basilar cisterns. This was discussed with Dr.  Derry Lory on 10/02/2023 at 0958 hours. 2. Intracranial CTA is remarkable for significantly attenuated - but not lost - enhancement of all anterior and posterior circulation branches. Differential considerations include diffuse intracranial vasospasm versus increasing intracranial pressure. This was communicated to Dr. Derry Lory at 1007 hours via the [facility] messaging system. 3. Negative for CTA spot sign.  No intracranial aneurysm identified.  Positive for age advanced atherosclerosis with hemodynamically significant stenoses: - left ICA siphon moderate stenosis. - left ICA bulb 65% stenosis. - right ICA siphon mild to moderate stenosis. - Severe stenosis of the Right Vertebral Artery origin. 4. Satisfactory endotracheal tube position. CTA chest abdomen pelvis reported separately. Electronically Signed   By: Odessa Fleming M.D.   On: 10/02/2023 10:33   CT CHEST ABDOMEN PELVIS WO CONTRAST  Result Date: 10/02/2023 CLINICAL DATA:  57 year old male code stroke presentation, intracranial hemorrhage. Chest pain. Sepsis. EXAM: CT CHEST, ABDOMEN AND PELVIS WITHOUT CONTRAST CT ANGIOGRAPHY CHEST, ABDOMEN AND PELVIS TECHNIQUE: Non-contrast CT of the chest abdomen and pelvis was initially obtained at 0950 hours. Multidetector CT imaging through the chest, abdomen and pelvis was performed at 1006 hours using the standard protocol during bolus administration of intravenous contrast. Multiplanar reconstructed images and MIPs were obtained and reviewed to evaluate the vascular anatomy. RADIATION DOSE REDUCTION: This exam was performed according to the departmental dose-optimization program which includes automated exposure control, adjustment of the  mA and/or kV according to patient size and/or use of iterative reconstruction technique. CONTRAST:  75mL OMNIPAQUE IOHEXOL 300 MG/ML  SOLN COMPARISON:  CTA head and neck today reported separately. FINDINGS: CT/CTA CHEST FINDINGS Cardiovascular: Heart size within normal limits. No pericardial effusion. Some calcified coronary artery plaque prior to contrast series 3, image 34. Mild tortuosity and atherosclerosis of the thoracic aorta. Negative for thoracic aortic dissection or aneurysm. Central pulmonary artery enhancement better demonstrated on the neck CTA reported separately. Mediastinum/Nodes: Negative. No mediastinal mass or lymphadenopathy. Lungs/Pleura: Intubated on both imaging sets. Unchanged mild to moderate dependent  secretions in the lower trachea and at the carina. Major airways remain patent. Dependent and enhancing atelectasis in the lower lobes appears unchanged on the pre versus postcontrast images. No pleural effusion, pneumothorax, or consolidation. Musculoskeletal: No displaced rib fracture identified. Thoracic spine, visible shoulder osseous structures and sternum appear intact. Review of the MIP images confirms the above findings. CT/CTA ABDOMEN AND PELVIS FINDINGS VASCULAR Aortoiliac calcified atherosclerosis. Negative for abdominal aortic aneurysm or dissection. Major arterial structures in the abdomen and pelvis are patent. Portal venous system not evaluated. Review of the MIP images confirms the above findings. NON-VASCULAR Hepatobiliary: Negative noncontrast and early postcontrast appearance of the liver and gallbladder. Pancreas: Negative. Spleen: Negative. Adrenals/Urinary Tract: Normal adrenal glands. Nonobstructed kidneys with symmetric renal enhancement and contrast excretion. Excreted contrast in the bladder on the 2nd imaging set. Stomach/Bowel: Mostly decompressed large and small bowel. Negative retrocecal appendix. Decompressed stomach and duodenum. No free air or free fluid. Somewhat indistinct appearance of the right colon on both sets of images but no convincing mesenteric inflammation. Lymphatic: No evidence of lymphadenopathy. Reproductive: Negative. Other: No pelvis free fluid. Musculoskeletal: No acute osseous abnormality identified. Review of the MIP images confirms the above findings. IMPRESSION: 1. Mildly tortuous aorta with Aortic Atherosclerosis (ICD10-I70.0). Negative for Aortic Dissection or Aneurysm. 2. Intubated with mild dependent atelectasis. 3. No other acute or inflammatory process identified in the chest, abdomen, or pelvis on noncontrast CT or subsequent CTA. Electronically Signed   By: Odessa Fleming M.D.   On: 10/02/2023 10:32   CT Angio Chest/Abd/Pel for Dissection W and/or Wo  Contrast  Result Date: 10/02/2023 CLINICAL DATA:  57 year old male code stroke presentation, intracranial hemorrhage. Chest pain. Sepsis. EXAM: CT CHEST, ABDOMEN AND PELVIS WITHOUT CONTRAST CT ANGIOGRAPHY CHEST, ABDOMEN AND PELVIS TECHNIQUE: Non-contrast CT of the chest abdomen and pelvis was initially obtained at 0950 hours. Multidetector CT imaging through the chest, abdomen and pelvis was performed at 1006 hours using the standard protocol during bolus administration of intravenous contrast. Multiplanar reconstructed images and MIPs were obtained and reviewed to evaluate the vascular anatomy. RADIATION DOSE REDUCTION: This exam was performed according to the departmental dose-optimization program which includes automated exposure control, adjustment of the mA and/or kV according to patient size and/or use of iterative reconstruction technique. CONTRAST:  75mL OMNIPAQUE IOHEXOL 300 MG/ML  SOLN COMPARISON:  CTA head and neck today reported separately. FINDINGS: CT/CTA CHEST FINDINGS Cardiovascular: Heart size within normal limits. No pericardial effusion. Some calcified coronary artery plaque prior to contrast series 3, image 34. Mild tortuosity and atherosclerosis of the thoracic aorta. Negative for thoracic aortic dissection or aneurysm. Central pulmonary artery enhancement better demonstrated on the neck CTA reported separately. Mediastinum/Nodes: Negative. No mediastinal mass or lymphadenopathy. Lungs/Pleura: Intubated on both imaging sets. Unchanged mild to moderate dependent secretions in the lower trachea and at the carina. Major airways remain patent. Dependent and enhancing atelectasis in  the lower lobes appears unchanged on the pre versus postcontrast images. No pleural effusion, pneumothorax, or consolidation. Musculoskeletal: No displaced rib fracture identified. Thoracic spine, visible shoulder osseous structures and sternum appear intact. Review of the MIP images confirms the above findings. CT/CTA  ABDOMEN AND PELVIS FINDINGS VASCULAR Aortoiliac calcified atherosclerosis. Negative for abdominal aortic aneurysm or dissection. Major arterial structures in the abdomen and pelvis are patent. Portal venous system not evaluated. Review of the MIP images confirms the above findings. NON-VASCULAR Hepatobiliary: Negative noncontrast and early postcontrast appearance of the liver and gallbladder. Pancreas: Negative. Spleen: Negative. Adrenals/Urinary Tract: Normal adrenal glands. Nonobstructed kidneys with symmetric renal enhancement and contrast excretion. Excreted contrast in the bladder on the 2nd imaging set. Stomach/Bowel: Mostly decompressed large and small bowel. Negative retrocecal appendix. Decompressed stomach and duodenum. No free air or free fluid. Somewhat indistinct appearance of the right colon on both sets of images but no convincing mesenteric inflammation. Lymphatic: No evidence of lymphadenopathy. Reproductive: Negative. Other: No pelvis free fluid. Musculoskeletal: No acute osseous abnormality identified. Review of the MIP images confirms the above findings. IMPRESSION: 1. Mildly tortuous aorta with Aortic Atherosclerosis (ICD10-I70.0). Negative for Aortic Dissection or Aneurysm. 2. Intubated with mild dependent atelectasis. 3. No other acute or inflammatory process identified in the chest, abdomen, or pelvis on noncontrast CT or subsequent CTA. Electronically Signed   By: Odessa Fleming M.D.   On: 10/02/2023 10:32     PHYSICAL EXAM  Temp:  [97.7 F (36.5 C)-98.7 F (37.1 C)] 97.9 F (36.6 C) (11/23 1130) Pulse Rate:  [60-88] 87 (11/23 1130) Resp:  [9-18] 12 (11/23 1130) BP: (94-141)/(64-87) 138/77 (11/23 1100) SpO2:  [94 %-100 %] 96 % (11/23 1130) Arterial Line BP: (108-167)/(52-72) 139/63 (11/23 1130) FiO2 (%):  [40 %] 40 % (11/23 1108) Weight:  [68.4 kg] 68.4 kg (11/23 0500)  General - Well nourished, well developed, intubated just off sedation.  Ophthalmologic - fundi not visualized  due to noncooperation.  Cardiovascular - Regular rate and rhythm.  Neuro - intubated just off sedation, eyes closed, not following commands. With forced eye opening, eyes in mid position, not blinking to visual threat, doll's eyes absent, not tracking, b/l pupil 2mm and sluggish to light. Corneal reflex weakly present on the left but not on the right, gag and cough present. Breathing over the vent.  Facial symmetry not able to test due to ET tube.  Tongue protrusion not cooperative. On pain stimulation, no significant movement in all extremities. Sensation, coordination and gait not tested.     ASSESSMENT/PLAN Mr. Minato Delanuez is a 57 y.o. male with history of hypertension admitted for found down at home, BP severely elevated, posturing, left gaze with most forming.  He was intubated for airway protection.  No tPA given due to ICH.    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  Left ICA siphon moderate stenosis, left ICA bulb 65% stenosis, right ICA siphon mild to moderate stenosis, severe stenosis right VA origin. CT repeat 11/23 stable large left BG hematoma, status post EVD, slightly decreased size of right lateral ventricle.  Midline shift decreased to 60 mm. MRI pending 2D Echo EF 70 to 75% LDL 69 HgbA1c 5.7 UDS positive for THC Heparin  subcu for VTE prophylaxis No antithrombotic prior to admission, now on No antithrombotic due to ICH Ongoing aggressive stroke risk factor management Therapy recommendations: Pending Disposition: Pending  Cerebral edema Hydrocephalus CT head combined And the increased ventricles, transependymal edema, effaced basilar cisterns Neurosurgery on board Status  post EVD On 3% saline @ 75 Na 140--139-143--152--154  Seizure like activity 11/22 overnight subtle lip twitching 11/23 intermittent eyelid twitching EEG severe to profound diffuse encephalopathy, no seizure. On keppra 500mg  -> 1000mg  bid Seizure precaution  Hypertensive emergency Unstable On Cleviprex, taper off as able Add amlodipine and coreg BP goal < 160 Long term BP goal normotensive  Respiratory failure Intubated for airway protection On vent Management per CCM On propofol and fentanyl  Fever Tmax 101.1 Central fever versus aspiration Leukocytosis WBC 20.1--18.9--12.9--12.6 Close monitoring CCM on board  AKI on CKD Creatinine 1.81--1.40--1.78 On IV fluid Close monitoring  Dysphagia Currently intubated on OG On IV fluid Will consider tube feeding once appropriate  Other Stroke Risk Factors   Other Active Problems Elevated LFTs, AST/ALT 104/76--101/46 Elevated troponin level, 82--2221--2344  Hospital day # 1  This patient is critically ill due to large ICH, IVH and ICH, hypertensive emergency, cerebral edema, respiratory failure, AKI on CKD, fever and at significant risk of neurological worsening, death form ankle herniation, renal failure, sepsis and seizure. This patient's care requires constant monitoring of vital signs, hemodynamics, respiratory and cardiac monitoring, review of multiple databases, neurological assessment, discussion with family, other specialists and medical decision making of high complexity. I spent 45 minutes of neurocritical care time in the care of this patient. I had long discussion with wife and brother at bedside, updated pt current condition, treatment plan and potential prognosis, and answered all the questions.  They expressed understanding and appreciation.    Marvel Plan, MD PhD Stroke Neurology 10/03/2023 11:50 AM    To contact Stroke Continuity provider, please refer to [email or web address]. After hours, contact General  Neurology

## 2023-10-03 NOTE — Progress Notes (Addendum)
OT Cancellation Note  Patient Details Name: Ryan Silva MRN: [id number] DOB: 1966-01-17   Cancelled Treatment:    Reason Eval/Treat Not Completed: Medical issues which prohibited therapy Patient is currently intubated and with troponin level over 2,000. OT will follow back to complete OT evaluation when medically appropriate.   Amendment at 11:21: OT speaking with RN, with patient currently with increased medical complications. Requesting we hold OT evaluations till medically stable and therefore signing off at this time. Please re-consult when medically appropriate.   Pollyann Glen E. Kush Far[place], OTR/L Acute Rehabilitation Services [phone]   Cherlyn Cushing 10/03/2023, 8:50 AM

## 2023-10-03 NOTE — Progress Notes (Addendum)
PT Cancellation Note  Patient Details Name: Ryan Silva MRN: [id number] DOB: 07-14-1966   Cancelled Treatment:    Reason Eval/Treat Not Completed: (P) Medical issues which prohibited therapy. Patient is currently intubated and with troponin level over 2,000. PT will follow back to complete PT evaluation when medically appropriate.   Addendum 14:06 - OT spoke with RN and updated PT that with patient currently with increased medical complications. Requesting we hold PT evaluations till medically stable and therefore signing off at this time. Please re-consult when medically appropriate.   Virgil Benedict, PT, DPT Acute Rehabilitation Services  Office: [phone]    Bettina Gavia 10/03/2023, 9:35 AM

## 2023-10-04 DIAGNOSIS — I161 Hypertensive emergency: Secondary | ICD-10-CM | POA: Diagnosis not present

## 2023-10-04 DIAGNOSIS — I615 Nontraumatic intracerebral hemorrhage, intraventricular: Secondary | ICD-10-CM | POA: Diagnosis not present

## 2023-10-04 DIAGNOSIS — I610 Nontraumatic intracerebral hemorrhage in hemisphere, subcortical: Secondary | ICD-10-CM | POA: Diagnosis not present

## 2023-10-04 LAB — CBC
HCT: 39.7 % (ref 39.0–52.0)
Hemoglobin: 13.4 g/dL (ref 13.0–17.0)
MCH: 32.9 pg (ref 26.0–34.0)
MCHC: 33.8 g/dL (ref 30.0–36.0)
MCV: 97.5 fL (ref 80.0–100.0)
Platelets: 119 10*3/uL — ABNORMAL LOW (ref 150–400)
RBC: 4.07 MIL/uL — ABNORMAL LOW (ref 4.22–5.81)
RDW: 14.7 % (ref 11.5–15.5)
WBC: 18.2 10*3/uL — ABNORMAL HIGH (ref 4.0–10.5)
nRBC: 0 % (ref 0.0–0.2)

## 2023-10-04 LAB — BASIC METABOLIC PANEL
BUN: 15 mg/dL (ref 6–20)
CO2: 20 mmol/L — ABNORMAL LOW (ref 22–32)
Calcium: 8.2 mg/dL — ABNORMAL LOW (ref 8.9–10.3)
Chloride: >130 mmol/L (ref 98–111)
Creatinine, Ser: 1.45 mg/dL — ABNORMAL HIGH (ref 0.61–1.24)
GFR, Estimated: 57 mL/min — ABNORMAL LOW (ref >60)
Glucose, Bld: 158 mg/dL — ABNORMAL HIGH (ref 70–99)
Potassium: 3.8 mmol/L (ref 3.5–5.1)
Sodium: 158 mmol/L — ABNORMAL HIGH (ref 135–145)

## 2023-10-04 LAB — SODIUM: Sodium: 162 mmol/L (ref 135–145)

## 2023-10-04 MED ORDER — FREE WATER
250.0000 mL | Status: DC
  Administered 2023-10-04 – 2023-10-05 (×5): 250 mL

## 2023-10-04 MED ORDER — FREE WATER: 200.0000 mL | Status: DC

## 2023-10-04 MED ORDER — STERILE WATER FOR INJECTION IJ SOLN
INTRAMUSCULAR | Status: AC
  Filled 2023-10-04: qty 10

## 2023-10-04 NOTE — Progress Notes (Signed)
SLP Cancellation Note  Patient Details Name: Ryan Silva MRN: [id number] DOB: April 19, 1966   Cancelled treatment:       Reason Eval/Treat Not Completed: Patient not medically ready. Will continue following.    Gwynneth Aliment, M.A., CF-SLP Speech Language Pathology, Acute Rehabilitation Services  Secure Chat preferred [phone]  10/04/2023, 8:31 AM

## 2023-10-04 NOTE — Plan of Care (Signed)
  Problem: Education: Goal: Knowledge of disease or condition will improve Outcome: Not Progressing Goal: Knowledge of secondary prevention will improve (MUST DOCUMENT ALL) Outcome: Not Progressing Goal: Knowledge of patient specific risk factors will improve Loraine Leriche N/A or DELETE if not current risk factor) Outcome: Not Progressing   Problem: Intracerebral Hemorrhage Tissue Perfusion: Goal: Complications of Intracerebral Hemorrhage will be minimized Outcome: Not Progressing   Problem: Coping: Goal: Will verbalize positive feelings about self Outcome: Not Progressing Goal: Will identify appropriate support needs Outcome: Not Progressing   Problem: Health Behavior/Discharge Planning: Goal: Ability to manage health-related needs will improve Outcome: Not Progressing Goal: Goals will be collaboratively established with patient/family Outcome: Not Progressing   Problem: Self-Care: Goal: Ability to participate in self-care as condition permits will improve Outcome: Not Progressing Goal: Verbalization of feelings and concerns over difficulty with self-care will improve Outcome: Not Progressing Goal: Ability to communicate needs accurately will improve Outcome: Not Progressing   Problem: Nutrition: Goal: Risk of aspiration will decrease Outcome: Not Progressing Goal: Dietary intake will improve Outcome: Not Progressing   Problem: Education: Goal: Ability to describe self-care measures that may prevent or decrease complications (Diabetes Survival Skills Education) will improve Outcome: Not Progressing Goal: Individualized Educational Video(s) Outcome: Not Progressing   Problem: Coping: Goal: Ability to adjust to condition or change in health will improve Outcome: Not Progressing   Problem: Fluid Volume: Goal: Ability to maintain a balanced intake and output will improve Outcome: Not Progressing   Problem: Health Behavior/Discharge Planning: Goal: Ability to identify and  utilize available resources and services will improve Outcome: Not Progressing Goal: Ability to manage health-related needs will improve Outcome: Not Progressing   Problem: Metabolic: Goal: Ability to maintain appropriate glucose levels will improve Outcome: Not Progressing   Problem: Nutritional: Goal: Maintenance of adequate nutrition will improve Outcome: Not Progressing Goal: Progress toward achieving an optimal weight will improve Outcome: Not Progressing   Problem: Skin Integrity: Goal: Risk for impaired skin integrity will decrease Outcome: Not Progressing   Problem: Tissue Perfusion: Goal: Adequacy of tissue perfusion will improve Outcome: Not Progressing   Problem: Education: Goal: Knowledge of General Education information will improve Description: Including pain rating scale, medication(s)/side effects and non-pharmacologic comfort measures Outcome: Not Progressing   Problem: Health Behavior/Discharge Planning: Goal: Ability to manage health-related needs will improve Outcome: Not Progressing   Problem: Clinical Measurements: Goal: Ability to maintain clinical measurements within normal limits will improve Outcome: Not Progressing Goal: Will remain free from infection Outcome: Not Progressing Goal: Diagnostic test results will improve Outcome: Not Progressing Goal: Respiratory complications will improve Outcome: Not Progressing Goal: Cardiovascular complication will be avoided Outcome: Not Progressing   Problem: Activity: Goal: Risk for activity intolerance will decrease Outcome: Not Progressing   Problem: Nutrition: Goal: Adequate nutrition will be maintained Outcome: Not Progressing   Problem: Coping: Goal: Level of anxiety will decrease Outcome: Not Progressing   Problem: Elimination: Goal: Will not experience complications related to bowel motility Outcome: Not Progressing Goal: Will not experience complications related to urinary  retention Outcome: Not Progressing   Problem: Pain Management: Goal: General experience of comfort will improve Outcome: Not Progressing   Problem: Safety: Goal: Ability to remain free from injury will improve Outcome: Not Progressing   Problem: Skin Integrity: Goal: Risk for impaired skin integrity will decrease Outcome: Not Progressing

## 2023-10-04 NOTE — Progress Notes (Signed)
NAME:  Ryan Silva, MRN:  [id number], DOB:  1966/02/05, LOS: 2 ADMISSION DATE:  10/02/2023, CONSULTATION DATE: 10/02/2023 REFERRING MD:  Rolla Flatten, MD , CHIEF COMPLAINT: Unresponsiveness  History of Present Illness:  57 year old male who was brought into the emergency department after he was found down by the bedside by his wife.  His last known well was 4 PM yesterday, upon arrival to ED patient was noted to be posturing, his GCS was 3 with systolic blood pressure ranging in 300s.  He was noted to have pinpoint pupils, he was given Narcan without much improvement, he was noted to vomiting from the mouth with a left gaze, he was immediately intubated.  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 CTA abdomen/pelvis/head and neck showed no aneurysm or aortic dissection. Patient was evaluated by neurosurgery, EVD was placed, evaluated by neurology, recommend admission to ICU  Pertinent  Medical History  Hypertension  Significant Hospital Events: Including procedures, antibiotic start and stop dates in addition to other pertinent events   11/22 EVD placed EEG 11/23 >> severe encephalopathy without any evidence of seizures or epileptiform discharges Echocardiogram 11/23 > apical cavity obliteration, LVEF 70-75% with moderate concentric LVH and grade 2 diastolic dysfunction.  RV size and function are normal  Interim History / Subjective:  -Fentanyl 100, propofol off this morning -BMP pending this morning, most recent Na 158 -I/O +3.1 L total   Objective   Blood pressure 120/68, pulse 63, temperature 98.1 F (36.7 C), resp. rate 17, height 5\' 10"  (1.778 m), weight 71 kg, SpO2 97%.    Vent Mode: PRVC FiO2 (%):  [40 %] 40 % Set Rate:  [18 bmp] 18 bmp Vt Set:  [440 mL] 440 mL PEEP:  [5 cmH20] 5 cmH20 Pressure  Support:  [3 cmH20] 3 cmH20 Plateau Pressure:  [7 cmH20-15 cmH20] 11 cmH20   Intake/Output Summary (Last 24 hours) at 10/04/2023 0908 Last data filed at 10/04/2023 0800 Gross per 24 hour  Intake 2421.48 ml  Output 2582 ml  Net -160.52 ml   Filed Weights   10/02/23 0900 10/03/23 0500 10/04/23 0443  Weight: 70 kg 68.4 kg 71 kg    Examination: General: Thin, intubated, critically ill HEENT: EVD in place, ET tube in place, pinpoint pupils, unreactive Neuro: Extensor posturing to pain.  Pupils pinpoint.  Does not open eyes to voice or pain. 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, ICH score of 4, with expected mortality 97% Severe cerebral edema with brain compression and midline shift Brain herniation syndrome Hypertensive emergency Acute obstructive hydrocephalus Possible seizure activity 11/23 Acute respiratory failure requiring mechanical ventilation due to the above Hypernatremia  Plans: -Appreciate neurosurgery management, EVD in place and operating -Adjust hypertonic saline for Na goal 150-155 -All anticoagulation held -SBP goal 100-150, on amlodipine 10 mg, carvedilol 25 twice daily, Cleviprex available -Keppra prophylaxis as ordered -Discussions underway with family regarding GOC.  Family aware of poor prognosis.  Have recommended a transition to DNR in the event of arrest.  After 72 hours observation we will need to broach the subject of formal withdrawal of care -Continue current mechanical ventilation, mental status precludes consideration of extubation  Best Practice (right click and "Reselect all SmartList Selections" daily)   Diet/type: NPO DVT prophylaxis: heparin injection 5,000 Units Start: 10/04/23  0600 SCDs Start: 10/02/23 1129   Pressure ulcer(s): not present on admission  GI prophylaxis: PPI Lines: N/A Foley:  Yes, and it is still needed Code Status:  full code Last date of multidisciplinary goals of care discussion [ongoing.  11/24: Family aware of prognosis, not yet ready to change CODE STATUS or to consider withdrawal]  Labs   CBC: Recent Labs  Lab 10/02/23 0932 10/02/23 1010 10/02/23 1129 10/03/23 0432 10/04/23 0432  WBC 20.1*  --   --  13.2* 18.2*  NEUTROABS 11.9*  --   --   --   --   HGB 18.9* 19.7*  19.7* 12.9* 12.6* 13.4  HCT 57.0* 58.0*  58.0* 38.0* 38.6* 39.7  MCV 93.1  --   --  94.4 97.5  PLT 165  --   --  101* 119*    Basic Metabolic Panel: Recent Labs  Lab 10/02/23 0932 10/02/23 1010 10/02/23 1129 10/02/23 1612 10/02/23 1810 10/03/23 0432 10/03/23 0957 10/03/23 1634 10/03/23 2221  NA 141 140  140 129*   < > 143 152* 154* 154* 158*  K 3.7 3.6  3.7 3.1*  --   --  4.0  --   --   --   CL 100 106  --   --   --  124*  --   --   --   CO2 13*  --   --   --   --  21*  --   --   --   GLUCOSE 178* 176*  --   --   --  122*  --   --   --   BUN 15 17  --   --   --  18  --   --   --   CREATININE 1.81* 1.40*  --   --   --  1.78*  --   --   --   CALCIUM 9.9  --   --   --   --  7.7*  --   --   --    < > = values in this interval not displayed.   GFR: Estimated Creatinine Clearance: 46.5 mL/min (A) (by C-G formula based on SCr of 1.78 mg/dL (H)). Recent Labs  Lab 10/02/23 0932 10/03/23 0432 10/04/23 0432  WBC 20.1* 13.2* 18.2*    Liver Function Tests: Recent Labs  Lab 10/02/23 0932 10/03/23 0432  AST 104* 101*  ALT 76* 46*  ALKPHOS 113 49  BILITOT 1.1 0.7  PROT 8.9* 5.4*  ALBUMIN 4.1 2.5*   No results for input(s): "LIPASE", "AMYLASE" in the last 168 hours. No results for input(s): "AMMONIA" in the last 168 hours.  ABG    Component Value Date/Time   PHART 7.311 (L) 10/02/2023 1129   PCO2ART 35.5 10/02/2023 1129   PO2ART 503 (H)  10/02/2023 1129   HCO3 17.9 (L) 10/02/2023 1129   TCO2 19 (L) 10/02/2023 1129   ACIDBASEDEF 8.0 (H) 10/02/2023 1129   O2SAT 100 10/02/2023 1129     Coagulation Profile: Recent Labs  Lab 10/03/23 0432  INR 1.3*    Cardiac Enzymes: Recent Labs  Lab 10/02/23 0932  CKTOTAL 179    HbA1C: Hgb A1c MFr Bld  Date/Time Value Ref Range Status  10/02/2023 09:32 AM 5.7 (H) 4.8 - 5.6 % Final    Comment:    (NOTE) Pre  diabetes:          5.7%-6.4%  Diabetes:              >6.4%  Glycemic control for   <7.0% adults with diabetes     CBG: Recent Labs  Lab 10/02/23 1520  GLUCAP 169*     Critical care time: 31 min     The patient is critically ill due to acute intraparenchymal basal ganglia hemorrhage/cerebral edema/brain herniation.  Critical care was necessary to treat or prevent imminent or life-threatening deterioration.  Critical care was time spent personally by me on the following activities: development of treatment plan with patient and/or surrogate as well as nursing, discussions with consultants, evaluation of patient's response to treatment, examination of patient, obtaining history from patient or surrogate, ordering and performing treatments and interventions, ordering and review of laboratory studies, ordering and review of radiographic studies, pulse oximetry, re-evaluation of patient's condition and participation in multidisciplinary rounds.   During this encounter critical care time was devoted to patient care services described in this note for minutes.    Levy Pupa, MD, PhD 10/04/2023, 9:08 AM [facility] [phone] or if no answer before 7:00PM call [phone] For any issues after 7:00PM please call eLink [phone]

## 2023-10-04 NOTE — Progress Notes (Signed)
   Providing Compassionate, Quality Care - Together  NEUROSURGERY PROGRESS NOTE   S: No issues overnight.   O: EXAM:  BP 120/68 (BP Location: Right Arm)   Pulse 63   Temp 98.1 F (36.7 C)   Resp 17   Ht 5\' 10"  (1.778 m)   Wt 71 kg   SpO2 97%   BMI 22.46 kg/m   Intubated, sedated Eyes closed Pupils pinpoint, minimally reactive, gaze midline No motor response upper/lower extremities EVD open to drain at 10 mmHg, ICP at 12 mmHg -Blood-tinged CSF in EVD drainage bag   ASSESSMENT:  57 y.o. male with    Large left basal ganglia hemorrhage with intraventricular extension and hydrocephalus   PLAN: -Continue EVD open to drain at 10 -family updated at bedside, all questions answered -no significant neuro improvement to this point -guarded prognosis    Thank you for allowing me to participate in this patient's care.  Please do not hesitate to call with questions or concerns.   Monia Pouch, DO Neurosurgeon [facility] [phone]

## 2023-10-04 NOTE — Progress Notes (Signed)
STROKE TEAM PROGRESS NOTE   SUBJECTIVE (INTERVAL HISTORY) No family is at the bedside. Pt still intubated on vent, not responsive. EVD patent. On low dose fentanyl. Na 158 this am, 3% saline was on hold. Family are leaning toward comfort care tomorrow.    OBJECTIVE Temp:  [97 F (36.1 C)-101.1 F (38.4 C)] 99.1 F (37.3 C) (11/24 1100) Pulse Rate:  [59-121] 64 (11/24 1100) Cardiac Rhythm: Normal sinus rhythm (11/24 0800) Resp:  [12-21] 16 (11/24 1100) BP: (112-203)/(65-83) 123/67 (11/24 1100) SpO2:  [92 %-97 %] 96 % (11/24 1100) Arterial Line BP: (128-191)/(54-69) 142/58 (11/24 1100) FiO2 (%):  [40 %] 40 % (11/24 0842) Weight:  [71 kg] 71 kg (11/24 0443)  Recent Labs  Lab 10/02/23 1520  GLUCAP 169*   Recent Labs  Lab 10/02/23 0932 10/02/23 1010 10/02/23 1129 10/02/23 1612 10/03/23 0432 10/03/23 0957 10/03/23 1634 10/03/23 2221 10/04/23 0740  NA 141 140  140 129*   < > 152* 154* 154* 158* 158*  K 3.7 3.6  3.7 3.1*  --  4.0  --   --   --  3.8  CL 100 106  --   --  124*  --   --   --  >130*  CO2 13*  --   --   --  21*  --   --   --  20*  GLUCOSE 178* 176*  --   --  122*  --   --   --  158*  BUN 15 17  --   --  18  --   --   --  15  CREATININE 1.81* 1.40*  --   --  1.78*  --   --   --  1.45*  CALCIUM 9.9  --   --   --  7.7*  --   --   --  8.2*   < > = values in this interval not displayed.   Recent Labs  Lab 10/02/23 0932 10/03/23 0432  AST 104* 101*  ALT 76* 46*  ALKPHOS 113 49  BILITOT 1.1 0.7  PROT 8.9* 5.4*  ALBUMIN 4.1 2.5*   Recent Labs  Lab 10/02/23 0932 10/02/23 1010 10/02/23 1129 10/03/23 0432 10/04/23 0432  WBC 20.1*  --   --  13.2* 18.2*  NEUTROABS 11.9*  --   --   --   --   HGB 18.9* 19.7*  19.7* 12.9* 12.6* 13.4  HCT 57.0* 58.0*  58.0* 38.0* 38.6* 39.7  MCV 93.1  --   --  94.4 97.5  PLT 165  --   --  101* 119*   Recent Labs  Lab 10/02/23 0932  CKTOTAL 179   Recent Labs    10/03/23 0432  LABPROT 16.2*  INR 1.3*   Recent  Labs    10/02/23 1332  COLORURINE YELLOW  LABSPEC 1.028  PHURINE 6.0  GLUCOSEU 150*  HGBUR LARGE*  BILIRUBINUR NEGATIVE  KETONESUR NEGATIVE  PROTEINUR >=300*  NITRITE NEGATIVE  LEUKOCYTESUR NEGATIVE       Component Value Date/Time   CHOL 113 10/03/2023 0432   TRIG 122 10/03/2023 0432   TRIG 122 10/03/2023 0432   HDL 20 (L) 10/03/2023 0432   CHOLHDL 5.7 10/03/2023 0432   VLDL 24 10/03/2023 0432   LDLCALC 69 10/03/2023 0432   Lab Results  Component Value Date   HGBA1C 5.7 (H) 10/02/2023      Component Value Date/Time   LABOPIA NONE DETECTED 10/02/2023 1330   COCAINSCRNUR NONE  DETECTED 10/02/2023 1330   LABBENZ NONE DETECTED 10/02/2023 1330   AMPHETMU NONE DETECTED 10/02/2023 1330   THCU POSITIVE (A) 10/02/2023 1330   LABBARB NONE DETECTED 10/02/2023 1330    No results for input(s): "ETH" in the last 168 hours.  I have personally reviewed the radiological images below and agree with the radiology interpretations.  ECHOCARDIOGRAM COMPLETE  Result Date: 10/03/2023    ECHOCARDIOGRAM REPORT   Patient Name:   Ryan Silva Date of Exam: 10/03/2023 Medical Rec #:  [id number]     Height:       70.0 in Accession #:    [id number]    Weight:       150.8 lb Date of Birth:  September 11, 1966      BSA:          1.851 m Patient Age:    57 years      BP:           161/70 mmHg Patient Gender: M             HR:           77 bpm. Exam Location:  Inpatient Procedure: 2D Echo, Color Doppler, Cardiac Doppler, Strain Analysis and            Intracardiac Opacification Agent Indications:    Stroke  History:        Patient has no prior history of Echocardiogram examinations.                 Risk Factors:Current Smoker.  Sonographer:    Ryan Silva Referring Phys: [id number] Ryan Silva IMPRESSIONS  1. Apical cavity obliteration. Left ventricular ejection fraction, by estimation, is 70 to 75%. The left ventricle has hyperdynamic function. The left ventricle has no regional wall motion abnormalities. There is  moderate concentric left ventricular hypertrophy. Left ventricular diastolic parameters are consistent with Grade II diastolic dysfunction (pseudonormalization).  2. Right ventricular systolic function is normal. The right ventricular size is normal.  3. Left atrial size was mildly dilated.  4. The mitral valve is normal in structure. No evidence of mitral valve regurgitation. No evidence of mitral stenosis.  5. The aortic valve is normal in structure. Aortic valve regurgitation is not visualized. No aortic stenosis is present.  6. Aortic dilatation noted. There is mild dilatation of the ascending aorta, measuring 40 mm.  7. The inferior vena cava is dilated in size with <50% respiratory variability, suggesting right atrial pressure of 15 mmHg. Conclusion(s)/Recommendation(s): Findings consistent with hypertrophic cardiomyopathy. No intracardiac source of embolism detected on this transthoracic study. Consider a transesophageal echocardiogram to exclude cardiac source of embolism if clinically indicated. FINDINGS  Left Ventricle: Apical cavity obliteration. Left ventricular ejection fraction, by estimation, is 70 to 75%. The left ventricle has hyperdynamic function. The left ventricle has no regional wall motion abnormalities. Definity contrast agent was given IV  to delineate the left ventricular endocardial borders. The left ventricular internal cavity size was normal in size. There is moderate concentric left ventricular hypertrophy. Left ventricular diastolic parameters are consistent with Grade II diastolic dysfunction (pseudonormalization). Right Ventricle: The right ventricular size is normal. No increase in right ventricular wall thickness. Right ventricular systolic function is normal. Left Atrium: Left atrial size was mildly dilated. Right Atrium: Right atrial size was normal in size. Pericardium: There is no evidence of pericardial effusion. Mitral Valve: The mitral valve is normal in structure. No  evidence of mitral valve regurgitation. No evidence of mitral valve stenosis. Tricuspid Valve:  The tricuspid valve is normal in structure. Tricuspid valve regurgitation is trivial. No evidence of tricuspid stenosis. Aortic Valve: The aortic valve is normal in structure. Aortic valve regurgitation is not visualized. No aortic stenosis is present. Aortic valve mean gradient measures 4.0 mmHg. Aortic valve peak gradient measures 7.8 mmHg. Aortic valve area, by VTI measures 2.10 cm. Pulmonic Valve: The pulmonic valve was normal in structure. Pulmonic valve regurgitation is not visualized. No evidence of pulmonic stenosis. Aorta: Aortic dilatation noted. There is mild dilatation of the ascending aorta, measuring 40 mm. Venous: The inferior vena cava is dilated in size with less than 50% respiratory variability, suggesting right atrial pressure of 15 mmHg. IAS/Shunts: No atrial level shunt detected by color flow Doppler.  LEFT VENTRICLE PLAX 2D LVIDd:         5.00 cm      Diastology LVIDs:         2.20 cm      LV e' medial:    5.00 cm/s LV PW:         0.80 cm      LV E/e' medial:  15.7 LV IVS:        1.20 cm      LV e' lateral:   4.79 cm/s LVOT diam:     1.70 cm      LV E/e' lateral: 16.4 LV SV:         51 LV SV Index:   27           2D Longitudinal Strain LVOT Area:     2.27 cm     2D Strain GLS Avg:     -18.5 %  LV Volumes (MOD) LV vol d, MOD A2C: 166.0 ml LV vol d, MOD A4C: 122.0 ml LV vol s, MOD A2C: 59.5 ml LV vol s, MOD A4C: 40.1 ml LV SV MOD A2C:     106.5 ml LV SV MOD A4C:     122.0 ml LV SV MOD BP:      93.7 ml RIGHT VENTRICLE             IVC RV Basal diam:  3.10 cm     IVC diam: 2.20 cm RV S prime:     21.40 cm/s TAPSE (M-mode): 2.3 cm LEFT ATRIUM             Index        RIGHT ATRIUM           Index LA diam:        4.30 cm 2.32 cm/m   RA Area:     15.00 cm LA Vol (A2C):   49.5 ml 26.74 ml/m  RA Volume:   32.00 ml  17.29 ml/m LA Vol (A4C):   58.5 ml 31.60 ml/m LA Biplane Vol: 55.2 ml 29.82 ml/m  AORTIC  VALVE AV Area (Vmax):    1.93 cm AV Area (Vmean):   1.95 cm AV Area (VTI):     2.10 cm AV Vmax:           140.00 cm/s AV Vmean:          88.400 cm/s AV VTI:            0.241 m AV Peak Grad:      7.8 mmHg AV Mean Grad:      4.0 mmHg LVOT Vmax:         119.00 cm/s LVOT Vmean:        76.000 cm/s LVOT VTI:  0.223 m LVOT/AV VTI ratio: 0.93  AORTA Ao Root diam: 3.40 cm Ao Asc diam:  4.00 cm MITRAL VALVE MV Area (PHT): 3.72 cm    SHUNTS MV Decel Time: 204 msec    Systemic VTI:  0.22 m MV E velocity: 78.60 cm/s  Systemic Diam: 1.70 cm MV A velocity: 51.90 cm/s MV E/A ratio:  1.51 Ryan Silva Electronically signed by Ryan Silva Signature Date/Time: 10/03/2023/1:06:33 PM    Final    EEG adult  Result Date: 10/03/2023 Ryan Quest, Silva     10/03/2023  8:52 AM Patient Name: Ryan Silva MRN: [id number] Epilepsy Attending: Charlsie Silva Referring Physician/Provider: Rejeana Brock, Silva Date: 10/03/2023 Duration: 23.23 mins Patient history: 58 yo m with lip twitching getting eeg to evaluate for  seizure Level of alertness:  comatose AEDs during EEG study: LEV, propofol Technical aspects: This EEG study was done with scalp electrodes positioned according to the 10-20 International system of electrode placement. Electrical activity was reviewed with band pass filter of 1-70Hz , sensitivity of 7 uV/mm, display speed of 51mm/sec with a 60Hz  notched filter applied as appropriate. EEG data were recorded continuously and digitally stored.  Video monitoring was available and reviewed as appropriate. Description:  EEG showed burst suppression with bursts of generalized polymorphic sharply contoured 3 to 6 Hz theta-delta slowing lasting 1-3 seconds alternating with generalized attenuation lasting 3-7 seconds. Hyperventilation and photic stimulation were not performed.    ABNORMALITY - Burst attenuation, generalized  IMPRESSION: This study is suggestive of severe to profound diffuse encephalopathy. No  seizures or epileptiform discharges were seen throughout the recording.  Ryan Silva   CT HEAD WO CONTRAST ( )  Result Date: 10/03/2023 CLINICAL DATA:  Stroke, follow-up EXAM: CT HEAD WITHOUT CONTRAST TECHNIQUE: Contiguous axial images were obtained from the base of the skull through the vertex without intravenous contrast. RADIATION DOSE REDUCTION: This exam was performed according to the departmental dose-optimization program which includes automated exposure control, adjustment of the mA and/or kV according to patient size and/or use of iterative reconstruction technique. COMPARISON:  10/02/2023 CT head FINDINGS: Brain: Redemonstrated large l intraparenchymal hematoma centered in the left basal ganglia, which measures up to 7.2 x 3.5 x 4.1 cm (AP x TR x CC) (series 6, image 47 and series 5, image 39), previously 7.9 x 3.9 x 5.4 cm when remeasured similarly. Increased diffusivity previously noted subarachnoid hemorrhage, with likely similar volume of intraventricular hemorrhage. Interval placement of a right frontal approach ventricular catheter, which terminates in the anterior body of the right lateral ventricle. Slightly decreased size the right lateral ventricle. 6 mm of left-to-right midline shift, previously 10 mm. The basilar cisterns remain effaced. No definite tonsillar herniation. Again noted is hypodensity in the pons. Likely transependymal edema. Vascular: No hyperdense vessel. Skull: Right frontal burr hole. Negative for acute fracture or suspicious osseous lesion. Sinuses/Orbits: Mucous retention cysts in the maxillary sinuses. Mucosal thickening in the ethmoid air cells. Redemonstrated dysconjugate gaze. No acute finding in the orbits. IMPRESSION: 1. Redemonstrated large intraparenchymal hematoma centered in the left basal ganglia, which measures up to 7.2 cm, slightly decreased from prior exam 2. Interval placement of a right frontal approach ventricular catheter, which terminates in  the anterior body of the right lateral ventricle. Slightly decreased size of the right lateral ventricle. 3. 6 mm of left-to-right midline shift, previously 10 mm. The basilar cisterns remain effaced. No definite herniation. Electronically Signed   By: Ryan Silva M.D.   On:  10/03/2023 03:25   DG CHEST PORT 1 VIEW  Result Date: 10/02/2023 CLINICAL DATA:  Abnormal respiration. EXAM: PORTABLE CHEST 1 VIEW COMPARISON:  None Available. FINDINGS: The heart size and mediastinal contours are within normal limits. Endotracheal and nasogastric tubes are grossly good position. Both lungs are clear. The visualized skeletal structures are unremarkable. IMPRESSION: No active disease. Electronically Signed   By: Lupita Raider M.D.   On: 10/02/2023 12:30   DG Abd 1 View  Result Date: 10/02/2023 CLINICAL DATA:  Altered mental status, abnormal respiration. EXAM: ABDOMEN - 1 VIEW COMPARISON:  None Available. FINDINGS: The bowel gas pattern is normal. Nasogastric tube tip is seen in proximal stomach. No radio-opaque calculi or other significant radiographic abnormality are seen. IMPRESSION: No abnormal bowel dilatation. Electronically Signed   By: Lupita Raider M.D.   On: 10/02/2023 12:29   CT ANGIO HEAD NECK W WO CM (CODE STROKE)  Result Date: 10/02/2023 CLINICAL DATA:  57 year old male code stroke presentation. Neurologic deficit. Chest pain. Sepsis. EXAM: CT ANGIOGRAPHY HEAD AND NECK WITH AND WITHOUT CONTRAST TECHNIQUE: Multidetector CT imaging of the head and neck was performed using the standard protocol during bolus administration of intravenous contrast. Multiplanar CT image reconstructions and MIPs were obtained to evaluate the vascular anatomy. Carotid stenosis measurements (when applicable) are obtained utilizing NASCET criteria, using the distal internal carotid diameter as the denominator. RADIATION DOSE REDUCTION: This exam was performed according to the departmental dose-optimization program which  includes automated exposure control, adjustment of the mA and/or kV according to patient size and/or use of iterative reconstruction technique. CONTRAST:  OMNIPAQUE IOHEXOL 300 MG/ML  SOLN COMPARISON:  None Available. FINDINGS: CT HEAD Brain: Severe intracranial hemorrhage. Large volume left hemisphere hyperdense intra-axial blood, epicenter at the left basal ganglia. Fairly extensive intraventricular and extra-axial blood extension. Estimated intra-axial blood volume is 97 mL (78 x 40 x 62 mm (AP by transverse by CC)). Large volume IVH. And moderate volume subarachnoid hemorrhage including in the basilar cisterns greater on the left, in the left sylvian fissure. There could be direct extension from the intra-axial blood to the subarachnoid space on series 2, image 16. Intracranial mass effect with effaced ventricles, rightward midline shift of 10 mm. Trapped occipital horns and right temporal horn. Transependymal edema. Effaced basilar cisterns. No definite tonsillar herniation. Brainstem seems hypodense on series 2, image 13, but might be artifactual due to the surrounding hyperdense blood. There is evidence of transependymal edema in the cerebral hemispheres, probably also advanced bilateral underlying white matter hypodensity. Calvarium and skull base: No skull fracture identified. Paranasal sinuses: Intubated. Well aerated paranasal sinuses, mastoids, tympanic cavities. Orbits: Disconjugate gaze. No discrete orbit or scalp soft tissue injury. Preliminary report of the above discussed by telephone with Dr. Derry Lory on 10/02/2023 at 0958 hours CTA NECK Skeleton: No acute osseous abnormality identified. Upper chest: Intubated. Endotracheal tube tip terminates above the carina. Small to moderate volume retained secretions in the lower trachea and carina but overall the visible major airways are patent. Relatively mild dependent atelectasis in the visible lungs. Central pulmonary arteries are enhancing and  patent. No superior mediastinal mass or lymphadenopathy. Other neck: Intubated with fluid in the pharynx. Otherwise negative. Aortic arch: 3 vessel arch. Mild arch atherosclerosis. Partially visible tortuosity of the descending thoracic aorta, up to 34 mm diameter. Right carotid system: Brachiocephalic artery and right CCA origin are normal. Negative right CCA. Patent right carotid bifurcation with minimal irregularity. Patent right ICA to the skull base with  mild tortuosity, no plaque or stenosis. Left carotid system: Similar mild tortuosity with no CCA plaque. Mild soft and calcified plaque at the left ICA origin. But bulky soft and calcified plaque at the distal left ICA bulb as seen on series 7, image 100 and series 12, image 33. Subsequent stenosis there numerically estimated at 65 % with respect to the distal vessel. Left ICA remains patent to the skull base. Vertebral arteries: Proximal right subclavian artery is patent with no significant plaque or stenosis. However, there is moderate to severe soft plaque and stenosis at the right vertebral artery origin on series 8, image 148. Right vertebral artery remains patent with mild additional V1 irregularity. Right vertebral artery remains enhancing to the skull base although with diminishing contrast density compared to the left side. See intracranial findings below. Proximal left subclavian artery and left vertebral artery origin are normal. Codominant left vertebral artery is enhancing and appears normal to the skull base. CTA HEAD Posterior circulation: Attenuated bilateral distal vertebral arteries, but earlier loss of enhancement in the right V4 segment as seen on series 5, image 153. Distal to the patent left PICA the left vertebral is irregular and stenotic. The vertebrobasilar junction is stenotic. The basilar artery is stenotic and irregular. But enhancement continues to the basilar tip as seen on series 11, image 33. SCA and PCA origins are patent. But  irregular. There is symmetric bilateral PCA branch enhancement which is irregular. Anterior circulation: Right ICA siphon is patent to the terminus with calcified siphon atherosclerosis. Mild to moderate right siphon stenosis. Contralateral left ICA siphon is enhancing but asymmetrically attenuated especially in the supraclinoid segment which appears moderately to severely stenotic series 9, image 147. Underlying moderate left siphon plaque elsewhere. MCA and ACA origins are patent but irregular (series 11, image 35). ACA branch enhancement is moderately attenuated but is maintained. See series 12, image 30. Bilateral MCA branch enhance mint similarly maintained but attenuated in a fairly symmetric pattern (series 10, image 20). There is mass effect on the left MCA branches from the intra-axial hemorrhage. No CTA spot sign. No intracranial aneurysm is identified. Posterior communicating arteries are diminutive or absent. Venous sinuses: Not evaluated due to early contrast timing. Anatomic variants: None significant. Review of the MIP images confirms the above findings IMPRESSION: 1. Severe Intracranial Hemorrhage: Large volume Left Basal ganglia bleed (97 mL) with extensive IVH and SAH. Intracranial mass effect with rightward midline shift of 10 mm. Combined trapped and effaced ventricles. Transependymal edema. Effaced basilar cisterns. This was discussed with Dr.  Derry Lory on 10/02/2023 at 0958 hours. 2. Intracranial CTA is remarkable for significantly attenuated - but not lost - enhancement of all anterior and posterior circulation branches. Differential considerations include diffuse intracranial vasospasm versus increasing intracranial pressure. This was communicated to Dr. Derry Lory at 1007 hours via the [facility] messaging system. 3. Negative for CTA spot sign.  No intracranial aneurysm identified. Positive for age advanced atherosclerosis with hemodynamically significant stenoses: - left ICA siphon moderate  stenosis. - left ICA bulb 65% stenosis. - right ICA siphon mild to moderate stenosis. - Severe stenosis of the Right Vertebral Artery origin. 4. Satisfactory endotracheal tube position. CTA chest abdomen pelvis reported separately. Electronically Signed   By: Odessa Fleming M.D.   On: 10/02/2023 10:33   CT CHEST ABDOMEN PELVIS WO CONTRAST  Result Date: 10/02/2023 CLINICAL DATA:  57 year old male code stroke presentation, intracranial hemorrhage. Chest pain. Sepsis. EXAM: CT CHEST, ABDOMEN AND PELVIS WITHOUT CONTRAST CT ANGIOGRAPHY  CHEST, ABDOMEN AND PELVIS TECHNIQUE: Non-contrast CT of the chest abdomen and pelvis was initially obtained at 0950 hours. Multidetector CT imaging through the chest, abdomen and pelvis was performed at 1006 hours using the standard protocol during bolus administration of intravenous contrast. Multiplanar reconstructed images and MIPs were obtained and reviewed to evaluate the vascular anatomy. RADIATION DOSE REDUCTION: This exam was performed according to the departmental dose-optimization program which includes automated exposure control, adjustment of the mA and/or kV according to patient size and/or use of iterative reconstruction technique. CONTRAST:  75mL OMNIPAQUE IOHEXOL 300 MG/ML  SOLN COMPARISON:  CTA head and neck today reported separately. FINDINGS: CT/CTA CHEST FINDINGS Cardiovascular: Heart size within normal limits. No pericardial effusion. Some calcified coronary artery plaque prior to contrast series 3, image 34. Mild tortuosity and atherosclerosis of the thoracic aorta. Negative for thoracic aortic dissection or aneurysm. Central pulmonary artery enhancement better demonstrated on the neck CTA reported separately. Mediastinum/Nodes: Negative. No mediastinal mass or lymphadenopathy. Lungs/Pleura: Intubated on both imaging sets. Unchanged mild to moderate dependent secretions in the lower trachea and at the carina. Major airways remain patent. Dependent and enhancing  atelectasis in the lower lobes appears unchanged on the pre versus postcontrast images. No pleural effusion, pneumothorax, or consolidation. Musculoskeletal: No displaced rib fracture identified. Thoracic spine, visible shoulder osseous structures and sternum appear intact. Review of the MIP images confirms the above findings. CT/CTA ABDOMEN AND PELVIS FINDINGS VASCULAR Aortoiliac calcified atherosclerosis. Negative for abdominal aortic aneurysm or dissection. Major arterial structures in the abdomen and pelvis are patent. Portal venous system not evaluated. Review of the MIP images confirms the above findings. NON-VASCULAR Hepatobiliary: Negative noncontrast and early postcontrast appearance of the liver and gallbladder. Pancreas: Negative. Spleen: Negative. Adrenals/Urinary Tract: Normal adrenal glands. Nonobstructed kidneys with symmetric renal enhancement and contrast excretion. Excreted contrast in the bladder on the 2nd imaging set. Stomach/Bowel: Mostly decompressed large and small bowel. Negative retrocecal appendix. Decompressed stomach and duodenum. No free air or free fluid. Somewhat indistinct appearance of the right colon on both sets of images but no convincing mesenteric inflammation. Lymphatic: No evidence of lymphadenopathy. Reproductive: Negative. Other: No pelvis free fluid. Musculoskeletal: No acute osseous abnormality identified. Review of the MIP images confirms the above findings. IMPRESSION: 1. Mildly tortuous aorta with Aortic Atherosclerosis (ICD10-I70.0). Negative for Aortic Dissection or Aneurysm. 2. Intubated with mild dependent atelectasis. 3. No other acute or inflammatory process identified in the chest, abdomen, or pelvis on noncontrast CT or subsequent CTA. Electronically Signed   By: Odessa Fleming M.D.   On: 10/02/2023 10:32   CT Angio Chest/Abd/Pel for Dissection W and/or Wo Contrast  Result Date: 10/02/2023 CLINICAL DATA:  57 year old male code stroke presentation, intracranial  hemorrhage. Chest pain. Sepsis. EXAM: CT CHEST, ABDOMEN AND PELVIS WITHOUT CONTRAST CT ANGIOGRAPHY CHEST, ABDOMEN AND PELVIS TECHNIQUE: Non-contrast CT of the chest abdomen and pelvis was initially obtained at 0950 hours. Multidetector CT imaging through the chest, abdomen and pelvis was performed at 1006 hours using the standard protocol during bolus administration of intravenous contrast. Multiplanar reconstructed images and MIPs were obtained and reviewed to evaluate the vascular anatomy. RADIATION DOSE REDUCTION: This exam was performed according to the departmental dose-optimization program which includes automated exposure control, adjustment of the mA and/or kV according to patient size and/or use of iterative reconstruction technique. CONTRAST:  75mL OMNIPAQUE IOHEXOL 300 MG/ML  SOLN COMPARISON:  CTA head and neck today reported separately. FINDINGS: CT/CTA CHEST FINDINGS Cardiovascular: Heart size within normal limits.  No pericardial effusion. Some calcified coronary artery plaque prior to contrast series 3, image 34. Mild tortuosity and atherosclerosis of the thoracic aorta. Negative for thoracic aortic dissection or aneurysm. Central pulmonary artery enhancement better demonstrated on the neck CTA reported separately. Mediastinum/Nodes: Negative. No mediastinal mass or lymphadenopathy. Lungs/Pleura: Intubated on both imaging sets. Unchanged mild to moderate dependent secretions in the lower trachea and at the carina. Major airways remain patent. Dependent and enhancing atelectasis in the lower lobes appears unchanged on the pre versus postcontrast images. No pleural effusion, pneumothorax, or consolidation. Musculoskeletal: No displaced rib fracture identified. Thoracic spine, visible shoulder osseous structures and sternum appear intact. Review of the MIP images confirms the above findings. CT/CTA ABDOMEN AND PELVIS FINDINGS VASCULAR Aortoiliac calcified atherosclerosis. Negative for abdominal aortic  aneurysm or dissection. Major arterial structures in the abdomen and pelvis are patent. Portal venous system not evaluated. Review of the MIP images confirms the above findings. NON-VASCULAR Hepatobiliary: Negative noncontrast and early postcontrast appearance of the liver and gallbladder. Pancreas: Negative. Spleen: Negative. Adrenals/Urinary Tract: Normal adrenal glands. Nonobstructed kidneys with symmetric renal enhancement and contrast excretion. Excreted contrast in the bladder on the 2nd imaging set. Stomach/Bowel: Mostly decompressed large and small bowel. Negative retrocecal appendix. Decompressed stomach and duodenum. No free air or free fluid. Somewhat indistinct appearance of the right colon on both sets of images but no convincing mesenteric inflammation. Lymphatic: No evidence of lymphadenopathy. Reproductive: Negative. Other: No pelvis free fluid. Musculoskeletal: No acute osseous abnormality identified. Review of the MIP images confirms the above findings. IMPRESSION: 1. Mildly tortuous aorta with Aortic Atherosclerosis (ICD10-I70.0). Negative for Aortic Dissection or Aneurysm. 2. Intubated with mild dependent atelectasis. 3. No other acute or inflammatory process identified in the chest, abdomen, or pelvis on noncontrast CT or subsequent CTA. Electronically Signed   By: Odessa Fleming M.D.   On: 10/02/2023 10:32     PHYSICAL EXAM  Temp:  [97 F (36.1 C)-101.1 F (38.4 C)] 99.1 F (37.3 C) (11/24 1100) Pulse Rate:  [59-121] 64 (11/24 1100) Resp:  [12-21] 16 (11/24 1100) BP: (112-203)/(65-83) 123/67 (11/24 1100) SpO2:  [92 %-97 %] 96 % (11/24 1100) Arterial Line BP: (128-191)/(54-69) 142/58 (11/24 1100) FiO2 (%):  [40 %] 40 % (11/24 0842) Weight:  [71 kg] 71 kg (11/24 0443)  General - Well nourished, well developed, intubated on low dose fentanyl.  Ophthalmologic - fundi not visualized due to noncooperation.  Cardiovascular - Regular rate and rhythm.  Neuro - intubated on low dose  fentanyl, eyes closed, not following commands. With forced eye opening, eyes in left gaze position, not blinking to visual threat, doll's eyes absent, not tracking, b/l pupil 2mm and sluggish to light. Corneal reflex weakly present bilaterally, gag and cough present. Breathing over the vent.  Facial symmetry not able to test due to ET tube.  Tongue protrusion not cooperative. On pain stimulation, no significant movement in all extremities. Sensation, coordination and gait not tested.   ASSESSMENT/PLAN Mr. Kaine Gillyard is a 58 y.o. male with history of hypertension admitted for found down at home, BP severely elevated, posturing, left gaze with most forming.  He was intubated for airway protection.  No tPA given due to ICH.    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  Left ICA siphon moderate stenosis, left ICA bulb 65% stenosis, right ICA siphon mild to moderate stenosis, severe stenosis right VA origin. CT repeat 11/23 stable large left BG hematoma, status post EVD, slightly decreased size of right lateral ventricle.  Midline shift decreased to 60 mm. MRI pending 2D Echo EF 70 to 75% LDL 69 HgbA1c 5.7 UDS positive for THC Heparin subcu for VTE prophylaxis No antithrombotic prior to admission, now on No antithrombotic due to ICH Ongoing aggressive stroke risk factor management Therapy recommendations: Pending Disposition: Pending, family leaning toward comfort care given the likely bad outcome, but would like to make decision tomorrow  Cerebral edema Hydrocephalus CT head combined And the increased ventricles, transependymal edema, effaced basilar cisterns Neurosurgery on board Status post  EVD On 3% saline @ 75-> off now Na 140--139-143--152--154--158  Seizure like activity 11/22 overnight subtle lip twitching 11/23 intermittent eyelid twitching EEG severe to profound diffuse encephalopathy, no seizure. On keppra 500mg  -> 1000mg  bid Seizure precaution  Hypertensive emergency Unstable On Cleviprex, taper off as able Add amlodipine and coreg BP goal < 160 Long term BP goal normotensive  Respiratory failure Intubated for airway protection On vent Management per CCM On fentanyl  Fever Tmax 101.1->99.1 Central fever versus aspiration Leukocytosis WBC 20.1--18.9--12.9--12.6--18.2 Close monitoring CCM on board  AKI on CKD Creatinine 1.81--1.40--1.78--1.45 On IV fluid Close monitoring  Dysphagia Currently intubated on OG On IV fluid Will consider tube feeding once appropriate  Other Stroke Risk Factors   Other Active Problems Elevated LFTs, AST/ALT 104/76--101/46 Elevated troponin level, 82--2221--2344  Hospital day # 2  This patient is critically ill due to large ICH, IVH and ICH, hypertensive emergency, cerebral edema, respiratory failure, AKI on CKD, fever and at significant risk of neurological worsening, death form ankle herniation, renal failure, sepsis and seizure. This patient's care requires constant monitoring of vital signs, hemodynamics, respiratory and cardiac monitoring, review of multiple databases, neurological assessment, discussion with family, other specialists and medical decision making of high complexity. I spent 35 minutes of neurocritical care time in the care of this patient. I discussed with Dr. Delton Coombes CCM.    Marvel Plan, MD PhD Stroke Neurology 10/04/2023 11:40 AM    To contact Stroke Continuity provider, please refer to [email or web address]. After hours, contact General Neurology

## 2023-10-05 ENCOUNTER — Inpatient Hospital Stay (HOSPITAL_COMMUNITY): Payer: Commercial Managed Care - HMO

## 2023-10-05 DIAGNOSIS — I610 Nontraumatic intracerebral hemorrhage in hemisphere, subcortical: Secondary | ICD-10-CM | POA: Diagnosis not present

## 2023-10-05 DIAGNOSIS — G936 Cerebral edema: Secondary | ICD-10-CM

## 2023-10-05 DIAGNOSIS — I615 Nontraumatic intracerebral hemorrhage, intraventricular: Secondary | ICD-10-CM | POA: Diagnosis not present

## 2023-10-05 DIAGNOSIS — J9601 Acute respiratory failure with hypoxia: Secondary | ICD-10-CM

## 2023-10-05 DIAGNOSIS — E870 Hyperosmolality and hypernatremia: Secondary | ICD-10-CM

## 2023-10-05 DIAGNOSIS — I161 Hypertensive emergency: Secondary | ICD-10-CM | POA: Diagnosis not present

## 2023-10-05 LAB — POCT I-STAT 7, (LYTES, BLD GAS, ICA,H+H)
Acid-base deficit: 3 mmol/L — ABNORMAL HIGH (ref 0.0–2.0)
Acid-base deficit: 1 mmol/L (ref 0.0–2.0)
Bicarbonate: 22.7 mmol/L (ref 20.0–28.0)
Bicarbonate: 23.3 mmol/L (ref 20.0–28.0)
Calcium, Ion: 1.25 mmol/L (ref 1.15–1.40)
Calcium, Ion: 1.24 mmol/L (ref 1.15–1.40)
HCT: 38 % — ABNORMAL LOW (ref 39.0–52.0)
HCT: 38 % — ABNORMAL LOW (ref 39.0–52.0)
Hemoglobin: 12.9 g/dL — ABNORMAL LOW (ref 13.0–17.0)
Hemoglobin: 12.9 g/dL — ABNORMAL LOW (ref 13.0–17.0)
O2 Saturation: 99 %
O2 Saturation: 100 %
Patient temperature: 37.3
Patient temperature: 37.3
Potassium: 3.4 mmol/L — ABNORMAL LOW (ref 3.5–5.1)
Potassium: 3.4 mmol/L — ABNORMAL LOW (ref 3.5–5.1)
Sodium: 158 mmol/L — ABNORMAL HIGH (ref 135–145)
Sodium: 157 mmol/L — ABNORMAL HIGH (ref 135–145)
TCO2: 24 mmol/L (ref 22–32)
TCO2: 24 mmol/L (ref 22–32)
pCO2 arterial: 42.9 mm[Hg] (ref 32–48)
pCO2 arterial: 38.7 mm[Hg] (ref 32–48)
pH, Arterial: 7.333 — ABNORMAL LOW (ref 7.35–7.45)
pH, Arterial: 7.389 (ref 7.35–7.45)
pO2, Arterial: 125 mm[Hg] — ABNORMAL HIGH (ref 83–108)
pO2, Arterial: 468 mm[Hg] — ABNORMAL HIGH (ref 83–108)

## 2023-10-05 LAB — BASIC METABOLIC PANEL
Anion gap: 5 (ref 5–15)
BUN: 24 mg/dL — ABNORMAL HIGH (ref 6–20)
CO2: 22 mmol/L (ref 22–32)
Calcium: 8.7 mg/dL — ABNORMAL LOW (ref 8.9–10.3)
Chloride: 128 mmol/L — ABNORMAL HIGH (ref 98–111)
Creatinine, Ser: 1.42 mg/dL — ABNORMAL HIGH (ref 0.61–1.24)
GFR, Estimated: 58 mL/min — ABNORMAL LOW (ref >60)
Glucose, Bld: 148 mg/dL — ABNORMAL HIGH (ref 70–99)
Potassium: 3.8 mmol/L (ref 3.5–5.1)
Sodium: 155 mmol/L — ABNORMAL HIGH (ref 135–145)

## 2023-10-05 LAB — PROTIME-INR
INR: 1.1 (ref 0.8–1.2)
Prothrombin Time: 14.8 s (ref 11.4–15.2)

## 2023-10-05 LAB — CBC
HCT: 40.7 % (ref 39.0–52.0)
HCT: 40.5 % (ref 39.0–52.0)
Hemoglobin: 13.7 g/dL (ref 13.0–17.0)
Hemoglobin: 15.1 g/dL (ref 13.0–17.0)
MCH: 32.2 pg (ref 26.0–34.0)
MCH: 35.4 pg — ABNORMAL HIGH (ref 26.0–34.0)
MCHC: 33.7 g/dL (ref 30.0–36.0)
MCHC: 37.3 g/dL — ABNORMAL HIGH (ref 30.0–36.0)
MCV: 95.5 fL (ref 80.0–100.0)
MCV: 94.8 fL (ref 80.0–100.0)
Platelets: 145 10*3/uL — ABNORMAL LOW (ref 150–400)
Platelets: 205 10*3/uL (ref 150–400)
RBC: 4.26 MIL/uL (ref 4.22–5.81)
RBC: 4.27 MIL/uL (ref 4.22–5.81)
RDW: 14.9 % (ref 11.5–15.5)
RDW: 14.7 % (ref 11.5–15.5)
WBC: 16.3 10*3/uL — ABNORMAL HIGH (ref 4.0–10.5)
WBC: 13.2 10*3/uL — ABNORMAL HIGH (ref 4.0–10.5)
nRBC: 0.2 % (ref 0.0–0.2)
nRBC: 0.4 % — ABNORMAL HIGH (ref 0.0–0.2)

## 2023-10-05 LAB — SODIUM
Sodium: 160 mmol/L — ABNORMAL HIGH (ref 135–145)
Sodium: 153 mmol/L — ABNORMAL HIGH (ref 135–145)

## 2023-10-05 LAB — APTT: aPTT: 41 s — ABNORMAL HIGH (ref 24–36)

## 2023-10-05 MED ORDER — FREE WATER
100.0000 mL | Status: DC
  Administered 2023-10-05 – 2023-10-06 (×7): 100 mL

## 2023-10-05 MED ORDER — HYDRALAZINE HCL 20 MG/ML IJ SOLN
10.0000 mg | Freq: Four times a day (QID) | INTRAMUSCULAR | Status: DC | PRN
  Administered 2023-10-05: 10 mg via INTRAVENOUS
  Filled 2023-10-05: qty 1

## 2023-10-05 MED ORDER — LABETALOL HCL 5 MG/ML IV SOLN: 10.0000 mg | INTRAVENOUS | Status: DC | PRN

## 2023-10-05 MED ORDER — HYDRALAZINE HCL 50 MG PO TABS: 100.0000 mg | ORAL_TABLET | Freq: Three times a day (TID) | ORAL | Status: DC

## 2023-10-05 MED ORDER — HYDRALAZINE HCL 50 MG PO TABS
50.0000 mg | ORAL_TABLET | Freq: Three times a day (TID) | ORAL | Status: DC
  Administered 2023-10-05 (×2): 50 mg
  Filled 2023-10-05 (×2): qty 1

## 2023-10-05 NOTE — Progress Notes (Addendum)
Spoke with Dr. Iver Nestle about pt's BP remaining above SBP goal of 150 despite being maxed at 32 mg/hr on Cleviprex and PRNs given.  PRN labetalol administration is limited due to pt's low HR.  Per Dr. Iver Nestle, new SBP goal is less than 160.  Pt's SBP currently in 170's.  Scheduled PO Coreg and Hydralazine just given.  MD Aware.    New orders from MD: PO hydralazine dose increased.  PRN 10 mg Hydralazine added.    Per MD, if PRN Hydralazine does not bring pt down to goal, contact CCM for further management of BP.

## 2023-10-05 NOTE — Progress Notes (Addendum)
    S: NAEs o/n.   O: EXAM:  BP 133/70   Pulse (!) 51   Temp (!) 97.5 F (36.4 C)   Resp 15   Ht 5\' 10"  (1.778 m)   Wt 69.4 kg   SpO2 96%   BMI 21.95 kg/m     Intubated, sedated Eyes closed Pupils pinpoint, minimally reactive, gaze midline Corneal reflex present b/l No motor response upper/lower extremities EVD open to drain at 10 mmHg, ICP at 12 mmHg -Blood-tinged CSF in EVD drainage bag  ASSESSMENT:  57 y.o. with    -Large left basal ganglia hemorrhage with intraventricular extension and hydrocephalus     PLAN: -Continue EVD at -Call w/ questions/concerns.   Ryan Silva, [facility]

## 2023-10-05 NOTE — TOC CAGE-AID Note (Signed)
Transition of Care [facility]) - CAGE-AID Screening   Patient Details  Name: Husnain Zahnow MRN: [id number] Date of Birth: 1966-10-24  [facility] CM/SW Contact:    Mearl Latin, LCSW Phone Number: 10/05/2023, 5:25 PM   Clinical Narrative: Patient currently intubated and unable to participate in screening.    CAGE-AID Screening: Substance Abuse Screening unable to be completed due to: : Patient unable to participate

## 2023-10-05 NOTE — Progress Notes (Signed)
Contacted Neurosurgery in reference to EVD no longer draining. Hildred Priest, NP advised that since the patient is now a donor, that no further intervention is needed. Advised to leave the drain in, due to unknown medical examiner case.

## 2023-10-05 NOTE — Progress Notes (Signed)
   10/05/23 2003  Vent Select  Invasive or Noninvasive Invasive  Adult Vent Y  Airway 7.5 mm  Placement Date/Time: 10/02/23 0930   Grade View: Grade 2  Placed By: ED Physician  Airway Device: Endotracheal Tube  Laryngoscope Blade: 3  ETT Types: Oral  Size (mm): 7.5 mm  Cuffed: Cuffed  Insertion attempts: 1  Airway Equipment: Stylet;Video Laryn...  Secured at (cm) 27 cm  Measured From Lips  Secured Location Right  Secured By Brewing technologist Repositioned Yes  Prone position No  Cuff Pressure (cm H2O) Clear OR 27-39 CmH2O  Site Condition Cool  Adult Ventilator Settings  Vent Type Servo i  Humidity HME  Vent Mode PRVC  Vt Set 440 mL  Set Rate 18 bmp  FiO2 (%) (S)  100 % (increased per donor services)  I Time 0.9 Sec(s)  PEEP 5 cmH20  Adult Ventilator Measurements  Peak Airway Pressure 19 L/min  Mean Airway Pressure 14 cmH20  Plateau Pressure 12 cmH20  Resp Rate Spontaneous 2 br/min  Resp Rate Total 20 br/min  Exhaled Vt 800 mL  Measured Ve 9.4 L  I:E Ratio Measured 1:2.7  Auto PEEP 0 cmH20  Total PEEP 5 cmH20  Adult Ventilator Alarms  Alarms On Y  Ve High Alarm 21 L/min  Ve Low Alarm 4 L/min  Resp Rate High Alarm 38 br/min  Resp Rate Low Alarm 10  PEEP Low Alarm 3 cmH2O  Press High Alarm 40 cmH2O  T Apnea 20 sec(s)  VAP Prevention  HOB> 30 Degrees Y  Equipment wiped down Yes  Daily Weaning Assessment  Daily Assessment of Readiness to Wean Wean protocol criteria not met  Reason not met Fi02 > 40%  Breath Sounds  Bilateral Breath Sounds Diminished  R Upper  Breath Sounds Clear  Vent Respiratory Assessment  Respiratory Pattern Regular;Unlabored  Airway Suctioning/Secretions  Suction Type ETT  Suction Device  Catheter  Secretion Amount Small  Secretion Color White;Yellow  Secretion Consistency Thick;Thin  Suction Tolerance Tolerated well  Suctioning Adverse Effects None   Recruitment maneuver done per donor services order placed pt on 20 of  peep for 30 seconds pt tolerated abg to follow in 30 mintues

## 2023-10-05 NOTE — Progress Notes (Signed)
SLP Cancellation Note  Patient Details Name: Garrette Jemerson MRN: [id number] DOB: 09/17/1966   Cancelled treatment:       Reason Eval/Treat Not Completed: Patient not medically ready. Pt remains on vent, not responsive. SLP to sign off a this time but please reorder if needed.     Mahala Menghini., M.A. CCC-SLP Acute Rehabilitation Services Office [phone]  Secure chat preferred  10/05/2023, 7:46 AM

## 2023-10-05 NOTE — Progress Notes (Addendum)
eLink Physician-Brief Progress Note Patient Name: Ryan Silva DOB: 1966/05/30 MRN: [id number]   Date of Service  10/05/2023  HPI/Events of Note  Basal ganglia bleed, s/p  ( EVD) Ventricular drain with sodium goal 155. Full code still.  Family deciding for comfort care in AM.  organ donor RN at bed side. Family has given a consent for organ( did not see any notes on epic. Organ donor RN sent family consent for same on Media- reviewed) and  Asking for Bronchoscopy from CCM team when they can.    eICU Interventions  CBG q4 hrly ordered for goal < 200.      Intervention Category Intermediate Interventions: Hyperglycemia - evaluation and treatment;Other:  Ranee Gosselin 10/05/2023, 9:02 PM  22:49 Notified CCM doc. For tonight, not planning for bronch, still officially , not on comfort care yet.   00:10 LR at 50 ml/hr ordered, per organ donor services.  00:28 Camera to room: Discussed with organ donation and bed side RN. MAP dropping, pupils dilated. HR is ok. Earlier in shift was hypertensive and got hydralazine 10 mg from bed side hospitalist doc.  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 . - LR 500 ml bolus - on Levophed at 10 via peripheral line.  Ordered vasopressin. Discussed with CCM - NP. She will place a central line. - once stable MAP, consider CT brain to look for/documenting herniation and discussion with family about comfort care tonight instead of waiting for AM.

## 2023-10-05 NOTE — Progress Notes (Signed)
Recruitment maneuver done per donor services order placed pt on 20 of peep for 30 seconds pt tolerated abg to follow in 30 mintues

## 2023-10-05 NOTE — Progress Notes (Signed)
STROKE TEAM PROGRESS NOTE   SUBJECTIVE (INTERVAL HISTORY) Sister is at the bedside. Pt still intubated on vent, not responsive. EVD patent. Still on low dose fentanyl. Na high yesterday, off 3% saline and started on FW per tube. Today Na 153, decreased FW dose. Sister is leaning toward comfort care, she will discuss with other family members.    OBJECTIVE Temp:  [97.3 F (36.3 C)-99.7 F (37.6 C)] 97.7 F (36.5 C) (11/25 1200) Pulse Rate:  [44-68] 53 (11/25 1200) Cardiac Rhythm: Normal sinus rhythm (11/25 0800) Resp:  [13-18] 15 (11/25 1200) BP: (114-148)/(66-82) 136/72 (11/25 1200) SpO2:  [90 %-98 %] 92 % (11/25 1200) Arterial Line BP: (126-181)/(52-67) 161/58 (11/25 1200) FiO2 (%):  [40 %] 40 % (11/25 1103) Weight:  [69.4 kg] 69.4 kg (11/25 0500)  Recent Labs  Lab 10/02/23 1520  GLUCAP 169*   Recent Labs  Lab 10/02/23 0932 10/02/23 1010 10/02/23 1129 10/02/23 1612 10/03/23 0432 10/03/23 0957 10/04/23 0740 10/04/23 1636 10/04/23 2157 10/05/23 0518 10/05/23 0936  NA 141 140  140 129*   < > 152*   < > 158* 162* 160* 155* 153*  K 3.7 3.6  3.7 3.1*  --  4.0  --  3.8  --   --  3.8  --   CL 100 106  --   --  124*  --  >130*  --   --  128*  --   CO2 13*  --   --   --  21*  --  20*  --   --  22  --   GLUCOSE 178* 176*  --   --  122*  --  158*  --   --  148*  --   BUN 15 17  --   --  18  --  15  --   --  24*  --   CREATININE 1.81* 1.40*  --   --  1.78*  --  1.45*  --   --  1.42*  --   CALCIUM 9.9  --   --   --  7.7*  --  8.2*  --   --  8.7*  --    < > = values in this interval not displayed.   Recent Labs  Lab 10/02/23 0932 10/03/23 0432  AST 104* 101*  ALT 76* 46*  ALKPHOS 113 49  BILITOT 1.1 0.7  PROT 8.9* 5.4*  ALBUMIN 4.1 2.5*   Recent Labs  Lab 10/02/23 0932 10/02/23 1010 10/02/23 1129 10/03/23 0432 10/04/23 0432 10/05/23 0518  WBC 20.1*  --   --  13.2* 18.2* 16.3*  NEUTROABS 11.9*  --   --   --   --   --   HGB 18.9* 19.7*  19.7* 12.9* 12.6* 13.4  13.7  HCT 57.0* 58.0*  58.0* 38.0* 38.6* 39.7 40.7  MCV 93.1  --   --  94.4 97.5 95.5  PLT 165  --   --  101* 119* 145*   Recent Labs  Lab 10/02/23 0932  CKTOTAL 179   Recent Labs    10/03/23 0432  LABPROT 16.2*  INR 1.3*   Recent Labs    10/02/23 1332  COLORURINE YELLOW  LABSPEC 1.028  PHURINE 6.0  GLUCOSEU 150*  HGBUR LARGE*  BILIRUBINUR NEGATIVE  KETONESUR NEGATIVE  PROTEINUR >=300*  NITRITE NEGATIVE  LEUKOCYTESUR NEGATIVE       Component Value Date/Time   CHOL 113 10/03/2023 0432   TRIG 122 10/03/2023 0432  TRIG 122 10/03/2023 0432   HDL 20 (L) 10/03/2023 0432   CHOLHDL 5.7 10/03/2023 0432   VLDL 24 10/03/2023 0432   LDLCALC 69 10/03/2023 0432   Lab Results  Component Value Date   HGBA1C 5.7 (H) 10/02/2023      Component Value Date/Time   LABOPIA NONE DETECTED 10/02/2023 1330   COCAINSCRNUR NONE DETECTED 10/02/2023 1330   LABBENZ NONE DETECTED 10/02/2023 1330   AMPHETMU NONE DETECTED 10/02/2023 1330   THCU POSITIVE (A) 10/02/2023 1330   LABBARB NONE DETECTED 10/02/2023 1330    No results for input(s): "ETH" in the last 168 hours.  I have personally reviewed the radiological images below and agree with the radiology interpretations.  ECHOCARDIOGRAM COMPLETE  Result Date: 10/03/2023    ECHOCARDIOGRAM REPORT   Patient Name:   Ryan Silva Date of Exam: 10/03/2023 Medical Rec #:  [id number]     Height:       70.0 in Accession #:    [id number]    Weight:       150.8 lb Date of Birth:  [birth year]      BSA:          1.851 m Patient Age:    57 years      BP:           161/70 mmHg Patient Gender: M             HR:           77 bpm. Exam Location:  Inpatient Procedure: 2D Echo, Color Doppler, Cardiac Doppler, Strain Analysis and            Intracardiac Opacification Agent Indications:    Stroke  History:        Patient has no prior history of Echocardiogram examinations.                 Risk Factors:Current Smoker.  Sonographer:    Raeford Razor RDCS Referring Phys:  [id number] SUDHAM CHAND IMPRESSIONS  1. Apical cavity obliteration. Left ventricular ejection fraction, by estimation, is 70 to 75%. The left ventricle has hyperdynamic function. The left ventricle has no regional wall motion abnormalities. There is moderate concentric left ventricular hypertrophy. Left ventricular diastolic parameters are consistent with Grade II diastolic dysfunction (pseudonormalization).  2. Right ventricular systolic function is normal. The right ventricular size is normal.  3. Left atrial size was mildly dilated.  4. The mitral valve is normal in structure. No evidence of mitral valve regurgitation. No evidence of mitral stenosis.  5. The aortic valve is normal in structure. Aortic valve regurgitation is not visualized. No aortic stenosis is present.  6. Aortic dilatation noted. There is mild dilatation of the ascending aorta, measuring 40 mm.  7. The inferior vena cava is dilated in size with <50% respiratory variability, suggesting right atrial pressure of 15 mmHg. Conclusion(s)/Recommendation(s): Findings consistent with hypertrophic cardiomyopathy. No intracardiac source of embolism detected on this transthoracic study. Consider a transesophageal echocardiogram to exclude cardiac source of embolism if clinically indicated. FINDINGS  Left Ventricle: Apical cavity obliteration. Left ventricular ejection fraction, by estimation, is 70 to 75%. The left ventricle has hyperdynamic function. The left ventricle has no regional wall motion abnormalities. Definity contrast agent was given IV  to delineate the left ventricular endocardial borders. The left ventricular internal cavity size was normal in size. There is moderate concentric left ventricular hypertrophy. Left ventricular diastolic parameters are consistent with Grade II diastolic dysfunction (pseudonormalization). Right Ventricle: The right ventricular size is normal.  No increase in right ventricular wall thickness. Right ventricular  systolic function is normal. Left Atrium: Left atrial size was mildly dilated. Right Atrium: Right atrial size was normal in size. Pericardium: There is no evidence of pericardial effusion. Mitral Valve: The mitral valve is normal in structure. No evidence of mitral valve regurgitation. No evidence of mitral valve stenosis. Tricuspid Valve: The tricuspid valve is normal in structure. Tricuspid valve regurgitation is trivial. No evidence of tricuspid stenosis. Aortic Valve: The aortic valve is normal in structure. Aortic valve regurgitation is not visualized. No aortic stenosis is present. Aortic valve mean gradient measures 4.0 mmHg. Aortic valve peak gradient measures 7.8 mmHg. Aortic valve area, by VTI measures 2.10 cm. Pulmonic Valve: The pulmonic valve was normal in structure. Pulmonic valve regurgitation is not visualized. No evidence of pulmonic stenosis. Aorta: Aortic dilatation noted. There is mild dilatation of the ascending aorta, measuring 40 mm. Venous: The inferior vena cava is dilated in size with less than 50% respiratory variability, suggesting right atrial pressure of 15 mmHg. IAS/Shunts: No atrial level shunt detected by color flow Doppler.  LEFT VENTRICLE PLAX 2D LVIDd:         5.00 cm      Diastology LVIDs:         2.20 cm      LV e' medial:    5.00 cm/s LV PW:         0.80 cm      LV E/e' medial:  15.7 LV IVS:        1.20 cm      LV e' lateral:   4.79 cm/s LVOT diam:     1.70 cm      LV E/e' lateral: 16.4 LV SV:         51 LV SV Index:   27           2D Longitudinal Strain LVOT Area:     2.27 cm     2D Strain GLS Avg:     -18.5 %  LV Volumes (MOD) LV vol d, MOD A2C: 166.0 ml LV vol d, MOD A4C: 122.0 ml LV vol s, MOD A2C: 59.5 ml LV vol s, MOD A4C: 40.1 ml LV SV MOD A2C:     106.5 ml LV SV MOD A4C:     122.0 ml LV SV MOD BP:      93.7 ml RIGHT VENTRICLE             IVC RV Basal diam:  3.10 cm     IVC diam: 2.20 cm RV S prime:     21.40 cm/s TAPSE (M-mode): 2.3 cm LEFT ATRIUM             Index         RIGHT ATRIUM           Index LA diam:        4.30 cm 2.32 cm/m   RA Area:     15.00 cm LA Vol (A2C):   49.5 ml 26.74 ml/m  RA Volume:   32.00 ml  17.29 ml/m LA Vol (A4C):   58.5 ml 31.60 ml/m LA Biplane Vol: 55.2 ml 29.82 ml/m  AORTIC VALVE AV Area (Vmax):    1.93 cm AV Area (Vmean):   1.95 cm AV Area (VTI):     2.10 cm AV Vmax:           140.00 cm/s AV Vmean:          88.400  cm/s AV VTI:            0.241 m AV Peak Grad:      7.8 mmHg AV Mean Grad:      4.0 mmHg LVOT Vmax:         119.00 cm/s LVOT Vmean:        76.000 cm/s LVOT VTI:          0.223 m LVOT/AV VTI ratio: 0.93  AORTA Ao Root diam: 3.40 cm Ao Asc diam:  4.00 cm MITRAL VALVE MV Area (PHT): 3.72 cm    SHUNTS MV Decel Time: 204 msec    Systemic VTI:  0.22 m MV E velocity: 78.60 cm/s  Systemic Diam: 1.70 cm MV A velocity: 51.90 cm/s MV E/A ratio:  1.51 Donato Schultz MD Electronically signed by Donato Schultz MD Signature Date/Time: 10/03/2023/1:06:33 PM    Final    EEG adult  Result Date: 10/03/2023 Charlsie Quest, MD     10/03/2023  8:52 AM Patient Name: Deakon Swindell MRN: [id number] Epilepsy Attending: Charlsie Quest Referring Physician/Provider: Rejeana Brock, MD Date: 10/03/2023 Duration: 23.23 mins Patient history: 56 yo m with lip twitching getting eeg to evaluate for  seizure Level of alertness:  comatose AEDs during EEG study: LEV, propofol Technical aspects: This EEG study was done with scalp electrodes positioned according to the 10-20 International system of electrode placement. Electrical activity was reviewed with band pass filter of 1-70Hz , sensitivity of 7 uV/mm, display speed of 52mm/sec with a 60Hz  notched filter applied as appropriate. EEG data were recorded continuously and digitally stored.  Video monitoring was available and reviewed as appropriate. Description:  EEG showed burst suppression with bursts of generalized polymorphic sharply contoured 3 to 6 Hz theta-delta slowing lasting 1-3 seconds alternating  with generalized attenuation lasting 3-7 seconds. Hyperventilation and photic stimulation were not performed.    ABNORMALITY - Burst attenuation, generalized  IMPRESSION: This study is suggestive of severe to profound diffuse encephalopathy. No seizures or epileptiform discharges were seen throughout the recording.  Priyanka Annabelle Harman   CT HEAD WO CONTRAST ( )  Result Date: 10/03/2023 CLINICAL DATA:  Stroke, follow-up EXAM: CT HEAD WITHOUT CONTRAST TECHNIQUE: Contiguous axial images were obtained from the base of the skull through the vertex without intravenous contrast. RADIATION DOSE REDUCTION: This exam was performed according to the departmental dose-optimization program which includes automated exposure control, adjustment of the mA and/or kV according to patient size and/or use of iterative reconstruction technique. COMPARISON:  10/02/2023 CT head FINDINGS: Brain: Redemonstrated large l intraparenchymal hematoma centered in the left basal ganglia, which measures up to 7.2 x 3.5 x 4.1 cm (AP x TR x CC) (series 6, image 47 and series 5, image 39), previously 7.9 x 3.9 x 5.4 cm when remeasured similarly. Increased diffusivity previously noted subarachnoid hemorrhage, with likely similar volume of intraventricular hemorrhage. Interval placement of a right frontal approach ventricular catheter, which terminates in the anterior body of the right lateral ventricle. Slightly decreased size the right lateral ventricle. 6 mm of left-to-right midline shift, previously 10 mm. The basilar cisterns remain effaced. No definite tonsillar herniation. Again noted is hypodensity in the pons. Likely transependymal edema. Vascular: No hyperdense vessel. Skull: Right frontal burr hole. Negative for acute fracture or suspicious osseous lesion. Sinuses/Orbits: Mucous retention cysts in the maxillary sinuses. Mucosal thickening in the ethmoid air cells. Redemonstrated dysconjugate gaze. No acute finding in the orbits. IMPRESSION:  1. Redemonstrated large intraparenchymal hematoma centered in the left basal ganglia,  which measures up to 7.2 cm, slightly decreased from prior exam 2. Interval placement of a right frontal approach ventricular catheter, which terminates in the anterior body of the right lateral ventricle. Slightly decreased size of the right lateral ventricle. 3. 6 mm of left-to-right midline shift, previously 10 mm. The basilar cisterns remain effaced. No definite herniation. Electronically Signed   By: Wiliam Ke M.D.   On: 10/03/2023 03:25   DG CHEST PORT 1 VIEW  Result Date: 10/02/2023 CLINICAL DATA:  Abnormal respiration. EXAM: PORTABLE CHEST 1 VIEW COMPARISON:  None Available. FINDINGS: The heart size and mediastinal contours are within normal limits. Endotracheal and nasogastric tubes are grossly good position. Both lungs are clear. The visualized skeletal structures are unremarkable. IMPRESSION: No active disease. Electronically Signed   By: Lupita Raider M.D.   On: 10/02/2023 12:30   DG Abd 1 View  Result Date: 10/02/2023 CLINICAL DATA:  Altered mental status, abnormal respiration. EXAM: ABDOMEN - 1 VIEW COMPARISON:  None Available. FINDINGS: The bowel gas pattern is normal. Nasogastric tube tip is seen in proximal stomach. No radio-opaque calculi or other significant radiographic abnormality are seen. IMPRESSION: No abnormal bowel dilatation. Electronically Signed   By: Lupita Raider M.D.   On: 10/02/2023 12:29   CT ANGIO HEAD NECK W WO CM (CODE STROKE)  Result Date: 10/02/2023 CLINICAL DATA:  57 year old male code stroke presentation. Neurologic deficit. Chest pain. Sepsis. EXAM: CT ANGIOGRAPHY HEAD AND NECK WITH AND WITHOUT CONTRAST TECHNIQUE: Multidetector CT imaging of the head and neck was performed using the standard protocol during bolus administration of intravenous contrast. Multiplanar CT image reconstructions and MIPs were obtained to evaluate the vascular anatomy. Carotid stenosis  measurements (when applicable) are obtained utilizing NASCET criteria, using the distal internal carotid diameter as the denominator. RADIATION DOSE REDUCTION: This exam was performed according to the departmental dose-optimization program which includes automated exposure control, adjustment of the mA and/or kV according to patient size and/or use of iterative reconstruction technique. CONTRAST:  OMNIPAQUE IOHEXOL 300 MG/ML  SOLN COMPARISON:  None Available. FINDINGS: CT HEAD Brain: Severe intracranial hemorrhage. Large volume left hemisphere hyperdense intra-axial blood, epicenter at the left basal ganglia. Fairly extensive intraventricular and extra-axial blood extension. Estimated intra-axial blood volume is 97 mL (78 x 40 x 62 mm (AP by transverse by CC)). Large volume IVH. And moderate volume subarachnoid hemorrhage including in the basilar cisterns greater on the left, in the left sylvian fissure. There could be direct extension from the intra-axial blood to the subarachnoid space on series 2, image 16. Intracranial mass effect with effaced ventricles, rightward midline shift of 10 mm. Trapped occipital horns and right temporal horn. Transependymal edema. Effaced basilar cisterns. No definite tonsillar herniation. Brainstem seems hypodense on series 2, image 13, but might be artifactual due to the surrounding hyperdense blood. There is evidence of transependymal edema in the cerebral hemispheres, probably also advanced bilateral underlying white matter hypodensity. Calvarium and skull base: No skull fracture identified. Paranasal sinuses: Intubated. Well aerated paranasal sinuses, mastoids, tympanic cavities. Orbits: Disconjugate gaze. No discrete orbit or scalp soft tissue injury. Preliminary report of the above discussed by telephone with Dr. Derry Lory on 10/02/2023 at 0958 hours CTA NECK Skeleton: No acute osseous abnormality identified. Upper chest: Intubated. Endotracheal tube tip terminates above  the carina. Small to moderate volume retained secretions in the lower trachea and carina but overall the visible major airways are patent. Relatively mild dependent atelectasis in the visible lungs. Central pulmonary arteries  are enhancing and patent. No superior mediastinal mass or lymphadenopathy. Other neck: Intubated with fluid in the pharynx. Otherwise negative. Aortic arch: 3 vessel arch. Mild arch atherosclerosis. Partially visible tortuosity of the descending thoracic aorta, up to 34 mm diameter. Right carotid system: Brachiocephalic artery and right CCA origin are normal. Negative right CCA. Patent right carotid bifurcation with minimal irregularity. Patent right ICA to the skull base with mild tortuosity, no plaque or stenosis. Left carotid system: Similar mild tortuosity with no CCA plaque. Mild soft and calcified plaque at the left ICA origin. But bulky soft and calcified plaque at the distal left ICA bulb as seen on series 7, image 100 and series 12, image 33. Subsequent stenosis there numerically estimated at 65 % with respect to the distal vessel. Left ICA remains patent to the skull base. Vertebral arteries: Proximal right subclavian artery is patent with no significant plaque or stenosis. However, there is moderate to severe soft plaque and stenosis at the right vertebral artery origin on series 8, image 148. Right vertebral artery remains patent with mild additional V1 irregularity. Right vertebral artery remains enhancing to the skull base although with diminishing contrast density compared to the left side. See intracranial findings below. Proximal left subclavian artery and left vertebral artery origin are normal. Codominant left vertebral artery is enhancing and appears normal to the skull base. CTA HEAD Posterior circulation: Attenuated bilateral distal vertebral arteries, but earlier loss of enhancement in the right V4 segment as seen on series 5, image 153. Distal to the patent left PICA the  left vertebral is irregular and stenotic. The vertebrobasilar junction is stenotic. The basilar artery is stenotic and irregular. But enhancement continues to the basilar tip as seen on series 11, image 33. SCA and PCA origins are patent. But irregular. There is symmetric bilateral PCA branch enhancement which is irregular. Anterior circulation: Right ICA siphon is patent to the terminus with calcified siphon atherosclerosis. Mild to moderate right siphon stenosis. Contralateral left ICA siphon is enhancing but asymmetrically attenuated especially in the supraclinoid segment which appears moderately to severely stenotic series 9, image 147. Underlying moderate left siphon plaque elsewhere. MCA and ACA origins are patent but irregular (series 11, image 35). ACA branch enhancement is moderately attenuated but is maintained. See series 12, image 30. Bilateral MCA branch enhance mint similarly maintained but attenuated in a fairly symmetric pattern (series 10, image 20). There is mass effect on the left MCA branches from the intra-axial hemorrhage. No CTA spot sign. No intracranial aneurysm is identified. Posterior communicating arteries are diminutive or absent. Venous sinuses: Not evaluated due to early contrast timing. Anatomic variants: None significant. Review of the MIP images confirms the above findings IMPRESSION: 1. Severe Intracranial Hemorrhage: Large volume Left Basal ganglia bleed (97 mL) with extensive IVH and SAH. Intracranial mass effect with rightward midline shift of 10 mm. Combined trapped and effaced ventricles. Transependymal edema. Effaced basilar cisterns. This was discussed with Dr.  Derry Lory on 10/02/2023 at 0958 hours. 2. Intracranial CTA is remarkable for significantly attenuated - but not lost - enhancement of all anterior and posterior circulation branches. Differential considerations include diffuse intracranial vasospasm versus increasing intracranial pressure. This was communicated to  Dr. Derry Lory at 1007 hours via the [facility] messaging system. 3. Negative for CTA spot sign.  No intracranial aneurysm identified. Positive for age advanced atherosclerosis with hemodynamically significant stenoses: - left ICA siphon moderate stenosis. - left ICA bulb 65% stenosis. - right ICA siphon mild to moderate stenosis. -  Severe stenosis of the Right Vertebral Artery origin. 4. Satisfactory endotracheal tube position. CTA chest abdomen pelvis reported separately. Electronically Signed   By: Odessa Fleming M.D.   On: 10/02/2023 10:33   CT CHEST ABDOMEN PELVIS WO CONTRAST  Result Date: 10/02/2023 CLINICAL DATA:  57 year old male code stroke presentation, intracranial hemorrhage. Chest pain. Sepsis. EXAM: CT CHEST, ABDOMEN AND PELVIS WITHOUT CONTRAST CT ANGIOGRAPHY CHEST, ABDOMEN AND PELVIS TECHNIQUE: Non-contrast CT of the chest abdomen and pelvis was initially obtained at 0950 hours. Multidetector CT imaging through the chest, abdomen and pelvis was performed at 1006 hours using the standard protocol during bolus administration of intravenous contrast. Multiplanar reconstructed images and MIPs were obtained and reviewed to evaluate the vascular anatomy. RADIATION DOSE REDUCTION: This exam was performed according to the departmental dose-optimization program which includes automated exposure control, adjustment of the mA and/or kV according to patient size and/or use of iterative reconstruction technique. CONTRAST:  75mL OMNIPAQUE IOHEXOL 300 MG/ML  SOLN COMPARISON:  CTA head and neck today reported separately. FINDINGS: CT/CTA CHEST FINDINGS Cardiovascular: Heart size within normal limits. No pericardial effusion. Some calcified coronary artery plaque prior to contrast series 3, image 34. Mild tortuosity and atherosclerosis of the thoracic aorta. Negative for thoracic aortic dissection or aneurysm. Central pulmonary artery enhancement better demonstrated on the neck CTA reported separately. Mediastinum/Nodes:  Negative. No mediastinal mass or lymphadenopathy. Lungs/Pleura: Intubated on both imaging sets. Unchanged mild to moderate dependent secretions in the lower trachea and at the carina. Major airways remain patent. Dependent and enhancing atelectasis in the lower lobes appears unchanged on the pre versus postcontrast images. No pleural effusion, pneumothorax, or consolidation. Musculoskeletal: No displaced rib fracture identified. Thoracic spine, visible shoulder osseous structures and sternum appear intact. Review of the MIP images confirms the above findings. CT/CTA ABDOMEN AND PELVIS FINDINGS VASCULAR Aortoiliac calcified atherosclerosis. Negative for abdominal aortic aneurysm or dissection. Major arterial structures in the abdomen and pelvis are patent. Portal venous system not evaluated. Review of the MIP images confirms the above findings. NON-VASCULAR Hepatobiliary: Negative noncontrast and early postcontrast appearance of the liver and gallbladder. Pancreas: Negative. Spleen: Negative. Adrenals/Urinary Tract: Normal adrenal glands. Nonobstructed kidneys with symmetric renal enhancement and contrast excretion. Excreted contrast in the bladder on the 2nd imaging set. Stomach/Bowel: Mostly decompressed large and small bowel. Negative retrocecal appendix. Decompressed stomach and duodenum. No free air or free fluid. Somewhat indistinct appearance of the right colon on both sets of images but no convincing mesenteric inflammation. Lymphatic: No evidence of lymphadenopathy. Reproductive: Negative. Other: No pelvis free fluid. Musculoskeletal: No acute osseous abnormality identified. Review of the MIP images confirms the above findings. IMPRESSION: 1. Mildly tortuous aorta with Aortic Atherosclerosis (ICD10-I70.0). Negative for Aortic Dissection or Aneurysm. 2. Intubated with mild dependent atelectasis. 3. No other acute or inflammatory process identified in the chest, abdomen, or pelvis on noncontrast CT or  subsequent CTA. Electronically Signed   By: Odessa Fleming M.D.   On: 10/02/2023 10:32   CT Angio Chest/Abd/Pel for Dissection W and/or Wo Contrast  Result Date: 10/02/2023 CLINICAL DATA:  57 year old male code stroke presentation, intracranial hemorrhage. Chest pain. Sepsis. EXAM: CT CHEST, ABDOMEN AND PELVIS WITHOUT CONTRAST CT ANGIOGRAPHY CHEST, ABDOMEN AND PELVIS TECHNIQUE: Non-contrast CT of the chest abdomen and pelvis was initially obtained at 0950 hours. Multidetector CT imaging through the chest, abdomen and pelvis was performed at 1006 hours using the standard protocol during bolus administration of intravenous contrast. Multiplanar reconstructed images and MIPs were  obtained and reviewed to evaluate the vascular anatomy. RADIATION DOSE REDUCTION: This exam was performed according to the departmental dose-optimization program which includes automated exposure control, adjustment of the mA and/or kV according to patient size and/or use of iterative reconstruction technique. CONTRAST:  75mL OMNIPAQUE IOHEXOL 300 MG/ML  SOLN COMPARISON:  CTA head and neck today reported separately. FINDINGS: CT/CTA CHEST FINDINGS Cardiovascular: Heart size within normal limits. No pericardial effusion. Some calcified coronary artery plaque prior to contrast series 3, image 34. Mild tortuosity and atherosclerosis of the thoracic aorta. Negative for thoracic aortic dissection or aneurysm. Central pulmonary artery enhancement better demonstrated on the neck CTA reported separately. Mediastinum/Nodes: Negative. No mediastinal mass or lymphadenopathy. Lungs/Pleura: Intubated on both imaging sets. Unchanged mild to moderate dependent secretions in the lower trachea and at the carina. Major airways remain patent. Dependent and enhancing atelectasis in the lower lobes appears unchanged on the pre versus postcontrast images. No pleural effusion, pneumothorax, or consolidation. Musculoskeletal: No displaced rib fracture identified.  Thoracic spine, visible shoulder osseous structures and sternum appear intact. Review of the MIP images confirms the above findings. CT/CTA ABDOMEN AND PELVIS FINDINGS VASCULAR Aortoiliac calcified atherosclerosis. Negative for abdominal aortic aneurysm or dissection. Major arterial structures in the abdomen and pelvis are patent. Portal venous system not evaluated. Review of the MIP images confirms the above findings. NON-VASCULAR Hepatobiliary: Negative noncontrast and early postcontrast appearance of the liver and gallbladder. Pancreas: Negative. Spleen: Negative. Adrenals/Urinary Tract: Normal adrenal glands. Nonobstructed kidneys with symmetric renal enhancement and contrast excretion. Excreted contrast in the bladder on the 2nd imaging set. Stomach/Bowel: Mostly decompressed large and small bowel. Negative retrocecal appendix. Decompressed stomach and duodenum. No free air or free fluid. Somewhat indistinct appearance of the right colon on both sets of images but no convincing mesenteric inflammation. Lymphatic: No evidence of lymphadenopathy. Reproductive: Negative. Other: No pelvis free fluid. Musculoskeletal: No acute osseous abnormality identified. Review of the MIP images confirms the above findings. IMPRESSION: 1. Mildly tortuous aorta with Aortic Atherosclerosis (ICD10-I70.0). Negative for Aortic Dissection or Aneurysm. 2. Intubated with mild dependent atelectasis. 3. No other acute or inflammatory process identified in the chest, abdomen, or pelvis on noncontrast CT or subsequent CTA. Electronically Signed   By: Odessa Fleming M.D.   On: 10/02/2023 10:32     PHYSICAL EXAM  Temp:  [97.3 F (36.3 C)-99.7 F (37.6 C)] 97.7 F (36.5 C) (11/25 1200) Pulse Rate:  [44-68] 53 (11/25 1200) Resp:  [13-18] 15 (11/25 1200) BP: (114-148)/(66-82) 136/72 (11/25 1200) SpO2:  [90 %-98 %] 92 % (11/25 1200) Arterial Line BP: (126-181)/(52-67) 161/58 (11/25 1200) FiO2 (%):  [40 %] 40 % (11/25 1103) Weight:  [69.4  kg] 69.4 kg (11/25 0500)  General - Well nourished, well developed, intubated on low dose fentanyl.  Ophthalmologic - fundi not visualized due to noncooperation.  Cardiovascular - Regular rate and rhythm.  Neuro - intubated on low dose fentanyl, eyes closed, not following commands. With forced eye opening, eyes in left gaze position, not blinking to visual threat, doll's eyes absent, not tracking, b/l pupil 2mm and sluggish to light. Corneal reflex weakly present bilaterally, gag and cough present. Breathing over the vent.  Facial symmetry not able to test due to ET tube.  Tongue protrusion not cooperative. On pain stimulation, no significant movement in all extremities. Sensation, coordination and gait not tested.   ASSESSMENT/PLAN Mr. Trine Abella is a 57 y.o. male with history of hypertension admitted for found down at home, BP severely elevated,  posturing, left gaze with most forming.  He was intubated for airway protection.  No tPA given due to ICH.    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  Left ICA siphon moderate stenosis, left ICA bulb 65% stenosis, right ICA siphon mild to moderate stenosis, severe stenosis right VA origin. CT repeat 11/23 stable large left BG hematoma, status post EVD, slightly decreased size of right lateral ventricle.  Midline shift decreased to 60 mm. 2D Echo EF 70 to 75% LDL 69 HgbA1c 5.7 UDS positive for THC Heparin subcu for VTE prophylaxis No antithrombotic prior to admission, now on No antithrombotic due to ICH Ongoing aggressive stroke risk factor management Therapy recommendations: Pending Disposition: Pending, sister leaning toward comfort care, she is  discussing with other families and let us know.  Cerebral edema Hydrocephalus CT head combined And the increased ventricles, transependymal edema, effaced basilar cisterns Neurosurgery on board Status post EVD On 3% saline @ 75-> off -> FW 250 Q4h -> FW 100 Q4h Na 140--139-143--152--154--158->162->155->153  Seizure like activity 11/22 overnight subtle lip twitching 11/23 intermittent eyelid twitching EEG severe to profound diffuse encephalopathy, no seizure. On keppra 500mg  -> 1000mg  bid Seizure precaution  Hypertensive emergency Unstable On Cleviprex, taper off as able Add amlodipine and coreg BP goal < 160 Long term BP goal normotensive  Respiratory failure Intubated for airway protection On vent Management per CCM On low dose fentanyl  Fever Tmax 101.1->99.1->afebrile Leukocytosis WBC 20.1--18.9--12.9--12.6--18.2--16.3 Close monitoring CCM on board  AKI on CKD Creatinine 1.81--1.40--1.78--1.45--1.42 On IV fluid Close monitoring  Dysphagia Currently intubated on OG On FW  Other Stroke Risk Factors   Other Active Problems Elevated LFTs, AST/ALT 104/76--101/46 Elevated troponin level, 82--2221--2344  Hospital day # 3   Marvel Plan, MD PhD Stroke Neurology 10/05/2023 12:03 PM    To contact Stroke Continuity provider, please refer to [email or web address]. After hours, contact General Neurology

## 2023-10-05 NOTE — Progress Notes (Signed)
Nutrition Brief Note  Chart reviewed. Noted pt will be transitioning to comfort care. No nutrition interventions planned at this time.  Please re-consult as needed.   Cammy Copa., RD, LDN, CNSC See AMiON for contact information

## 2023-10-05 NOTE — Progress Notes (Signed)
NAME:  Ryan Silva, MRN:  [id number], DOB:  1966-01-08, LOS: 3 ADMISSION DATE:  10/02/2023, CONSULTATION DATE: 10/02/2023 REFERRING MD:  Rolla Flatten, MD , CHIEF COMPLAINT: Unresponsiveness  History of Present Illness:  57 year old male who was brought into the emergency department after he was found down by the bedside by his wife.  His last known well was 4 PM yesterday, upon arrival to ED patient was noted to be posturing, his GCS was 3 with systolic blood pressure ranging in 300s.  He was noted to have pinpoint pupils, he was given Narcan without much improvement, he was noted to vomiting from the mouth with a left gaze, he was immediately intubated.  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 CTA abdomen/pelvis/head and neck showed no aneurysm or aortic dissection. Patient was evaluated by neurosurgery, EVD was placed, evaluated by neurology, recommend admission to ICU  Pertinent  Medical History  Hypertension  Significant Hospital Events: Including procedures, antibiotic start and stop dates in addition to other pertinent events   11/22 EVD placed EEG 11/23 >> severe encephalopathy without any evidence of seizures or epileptiform discharges Echocardiogram 11/23 > apical cavity obliteration, LVEF 70-75% with moderate concentric LVH and grade 2 diastolic dysfunction.  RV size and function are normal  Interim History / Subjective:  No change overnight. Still not responsive.    Objective   Blood pressure 136/72, pulse (!) 53, temperature 97.7 F (36.5 C), resp. rate 15, height 5\' 10"  (1.778 m), weight 69.4 kg, SpO2 92%.    Vent Mode: PRVC FiO2 (%):  [40 %] 40 % Set Rate:  [18 bmp] 18 bmp Vt Set:  [440 mL] 440 mL PEEP:  [5 cmH20] 5 cmH20 Plateau Pressure:  [11 cmH20-13 cmH20] 13 cmH20   Intake/Output  Summary (Last 24 hours) at 10/05/2023 1303 Last data filed at 10/05/2023 1200 Gross per 24 hour  Intake 1511.49 ml  Output 655 ml  Net 856.49 ml   Filed Weights   10/03/23 0500 10/04/23 0443 10/05/23 0500  Weight: 68.4 kg 71 kg 69.4 kg    Examination: Gen:      Intubated, sedated, acutely ill appearing HEENT:  ETT to vent, EVD with serosanguinous output Lungs:    sounds of mechanical ventilation auscultated no wheeze CV:         RRR Abd:      + bowel sounds; soft, non-tender; no palpable masses, no distension Ext:    No edema Skin:      Warm and dry; no rashes Neuro:   sedated, RASS -5     Resolved Hospital Problem list   As above  Assessment & Plan:  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, ICH score of 4, with expected mortality 97% Severe cerebral edema with brain compression and midline shift Brain herniation syndrome Hypertensive emergency Acute obstructive hydrocephalus Possible seizure activity 11/23 Acute respiratory failure requiring mechanical ventilation due to the above Hypernatremia  Plans: -Appreciate neurosurgery management, EVD in place and operating -Adjust hypertonic saline for Na goal 150-155 -All anticoagulation held -SBP goal 100-150, on amlodipine 10 mg, carvedilol 25 twice daily, Cleviprex available -Keppra prophylaxis as ordered -Discussions underway with family regarding GOC.  Family aware of poor prognosis.  Have recommended a transition to DNR in the event of arrest.  -Continue current mechanical ventilation, mental status precludes consideration of extubation - Patient's family  at bedside. Reviewed CT head with her and discussed overall poor prognosis. She would like to transition to comfort measures later today once other family members can arrive to bedside. Discussed likely progression to brain death based on severity of injury despite current level of care.    Best Practice (right click and  "Reselect all SmartList Selections" daily)   Diet/type: NPO DVT prophylaxis: heparin injection 5,000 Units Start: 10/04/23 0600 SCDs Start: 10/02/23 1129   Pressure ulcer(s): not present on admission  GI prophylaxis: PPI Lines: N/A Foley:  Yes, and it is still needed Code Status:  full code Last date of multidisciplinary goals of care discussion [ongoing.  11.25 likely comfortcare later today.]  The patient is critically ill due to encephalopathy.  Critical care was necessary to treat or prevent imminent or life-threatening deterioration.  Critical care was time spent personally by me on the following activities: development of treatment plan with patient and/or surrogate as well as nursing, discussions with consultants, evaluation of patient's response to treatment, examination of patient, obtaining history from patient or surrogate, ordering and performing treatments and interventions, ordering and review of laboratory studies, ordering and review of radiographic studies, pulse oximetry, re-evaluation of patient's condition and participation in multidisciplinary rounds.   Critical Care Time devoted to patient care services described in this note is 35 minutes. This time reflects time of care of this signee Charlott Holler . This critical care time does not reflect separately billable procedures or procedure time, teaching time or supervisory time of PA/NP/Med student/Med Resident etc but could involve care discussion time.       Charlott Holler [place] [facility] 10/05/2023 1:04 PM  Pager: see AMION  If no response to pager , please call critical care on call (see AMION) until 7pm After 7:00 pm call Elink

## 2023-10-06 ENCOUNTER — Inpatient Hospital Stay (HOSPITAL_COMMUNITY): Payer: Commercial Managed Care - HMO

## 2023-10-06 DIAGNOSIS — I161 Hypertensive emergency: Secondary | ICD-10-CM | POA: Diagnosis not present

## 2023-10-06 DIAGNOSIS — G936 Cerebral edema: Secondary | ICD-10-CM | POA: Diagnosis not present

## 2023-10-06 DIAGNOSIS — I619 Nontraumatic intracerebral hemorrhage, unspecified: Secondary | ICD-10-CM | POA: Diagnosis present

## 2023-10-06 DIAGNOSIS — I615 Nontraumatic intracerebral hemorrhage, intraventricular: Secondary | ICD-10-CM | POA: Diagnosis not present

## 2023-10-06 DIAGNOSIS — J9601 Acute respiratory failure with hypoxia: Secondary | ICD-10-CM | POA: Diagnosis not present

## 2023-10-06 LAB — POCT I-STAT 7, (LYTES, BLD GAS, ICA,H+H)
Acid-Base Excess: 0 mmol/L (ref 0.0–2.0)
Acid-Base Excess: 0 mmol/L (ref 0.0–2.0)
Acid-base deficit: 1 mmol/L (ref 0.0–2.0)
Acid-base deficit: 1 mmol/L (ref 0.0–2.0)
Acid-base deficit: 2 mmol/L (ref 0.0–2.0)
Acid-base deficit: 3 mmol/L — ABNORMAL HIGH (ref 0.0–2.0)
Acid-base deficit: 3 mmol/L — ABNORMAL HIGH (ref 0.0–2.0)
Bicarbonate: 21.9 mmol/L (ref 20.0–28.0)
Bicarbonate: 23.6 mmol/L (ref 20.0–28.0)
Bicarbonate: 23.6 mmol/L (ref 20.0–28.0)
Bicarbonate: 24.2 mmol/L (ref 20.0–28.0)
Bicarbonate: 24.3 mmol/L (ref 20.0–28.0)
Bicarbonate: 24.9 mmol/L (ref 20.0–28.0)
Bicarbonate: 25.2 mmol/L (ref 20.0–28.0)
Calcium, Ion: 1.22 mmol/L (ref 1.15–1.40)
Calcium, Ion: 1.28 mmol/L (ref 1.15–1.40)
Calcium, Ion: 1.28 mmol/L (ref 1.15–1.40)
Calcium, Ion: 1.3 mmol/L (ref 1.15–1.40)
Calcium, Ion: 1.3 mmol/L (ref 1.15–1.40)
Calcium, Ion: 1.3 mmol/L (ref 1.15–1.40)
Calcium, Ion: 1.3 mmol/L (ref 1.15–1.40)
HCT: 31 % — ABNORMAL LOW (ref 39.0–52.0)
HCT: 31 % — ABNORMAL LOW (ref 39.0–52.0)
HCT: 31 % — ABNORMAL LOW (ref 39.0–52.0)
HCT: 31 % — ABNORMAL LOW (ref 39.0–52.0)
HCT: 31 % — ABNORMAL LOW (ref 39.0–52.0)
HCT: 32 % — ABNORMAL LOW (ref 39.0–52.0)
HCT: 37 % — ABNORMAL LOW (ref 39.0–52.0)
Hemoglobin: 10.5 g/dL — ABNORMAL LOW (ref 13.0–17.0)
Hemoglobin: 10.5 g/dL — ABNORMAL LOW (ref 13.0–17.0)
Hemoglobin: 10.5 g/dL — ABNORMAL LOW (ref 13.0–17.0)
Hemoglobin: 10.5 g/dL — ABNORMAL LOW (ref 13.0–17.0)
Hemoglobin: 10.5 g/dL — ABNORMAL LOW (ref 13.0–17.0)
Hemoglobin: 10.9 g/dL — ABNORMAL LOW (ref 13.0–17.0)
Hemoglobin: 12.6 g/dL — ABNORMAL LOW (ref 13.0–17.0)
O2 Saturation: 100 %
O2 Saturation: 100 %
O2 Saturation: 100 %
O2 Saturation: 100 %
O2 Saturation: 100 %
O2 Saturation: 95 %
O2 Saturation: 98 %
Patient temperature: 36
Patient temperature: 36.4
Patient temperature: 36.4
Patient temperature: 37.1
Patient temperature: 37.4
Patient temperature: 97
Patient temperature: 99
Potassium: 3.3 mmol/L — ABNORMAL LOW (ref 3.5–5.1)
Potassium: 3.4 mmol/L — ABNORMAL LOW (ref 3.5–5.1)
Potassium: 3.5 mmol/L (ref 3.5–5.1)
Potassium: 3.5 mmol/L (ref 3.5–5.1)
Potassium: 3.6 mmol/L (ref 3.5–5.1)
Potassium: 3.6 mmol/L (ref 3.5–5.1)
Potassium: 3.6 mmol/L (ref 3.5–5.1)
Sodium: 155 mmol/L — ABNORMAL HIGH (ref 135–145)
Sodium: 156 mmol/L — ABNORMAL HIGH (ref 135–145)
Sodium: 161 mmol/L (ref 135–145)
Sodium: 162 mmol/L (ref 135–145)
Sodium: 164 mmol/L (ref 135–145)
Sodium: 164 mmol/L (ref 135–145)
Sodium: 165 mmol/L (ref 135–145)
TCO2: 23 mmol/L (ref 22–32)
TCO2: 25 mmol/L (ref 22–32)
TCO2: 25 mmol/L (ref 22–32)
TCO2: 25 mmol/L (ref 22–32)
TCO2: 25 mmol/L (ref 22–32)
TCO2: 26 mmol/L (ref 22–32)
TCO2: 26 mmol/L (ref 22–32)
pCO2 arterial: 38.3 mm[Hg] (ref 32–48)
pCO2 arterial: 38.6 mm[Hg] (ref 32–48)
pCO2 arterial: 39.7 mm[Hg] (ref 32–48)
pCO2 arterial: 41.2 mm[Hg] (ref 32–48)
pCO2 arterial: 41.2 mm[Hg] (ref 32–48)
pCO2 arterial: 41.3 mm[Hg] (ref 32–48)
pCO2 arterial: 48.8 mm[Hg] — ABNORMAL HIGH (ref 32–48)
pH, Arterial: 7.29 — ABNORMAL LOW (ref 7.35–7.45)
pH, Arterial: 7.368 (ref 7.35–7.45)
pH, Arterial: 7.377 (ref 7.35–7.45)
pH, Arterial: 7.379 (ref 7.35–7.45)
pH, Arterial: 7.391 (ref 7.35–7.45)
pH, Arterial: 7.392 (ref 7.35–7.45)
pH, Arterial: 7.402 (ref 7.35–7.45)
pO2, Arterial: 113 mm[Hg] — ABNORMAL HIGH (ref 83–108)
pO2, Arterial: 324 mm[Hg] — ABNORMAL HIGH (ref 83–108)
pO2, Arterial: 414 mm[Hg] — ABNORMAL HIGH (ref 83–108)
pO2, Arterial: 436 mm[Hg] — ABNORMAL HIGH (ref 83–108)
pO2, Arterial: 447 mm[Hg] — ABNORMAL HIGH (ref 83–108)
pO2, Arterial: 505 mm[Hg] — ABNORMAL HIGH (ref 83–108)
pO2, Arterial: 79 mm[Hg] — ABNORMAL LOW (ref 83–108)

## 2023-10-06 LAB — HEPATIC FUNCTION PANEL
ALT: 36 U/L (ref 0–44)
ALT: 27 U/L (ref 0–44)
ALT: 27 U/L (ref 0–44)
ALT: 25 U/L (ref 0–44)
ALT: 25 U/L (ref 0–44)
AST: 100 U/L — ABNORMAL HIGH (ref 15–41)
AST: 57 U/L — ABNORMAL HIGH (ref 15–41)
AST: 52 U/L — ABNORMAL HIGH (ref 15–41)
AST: 49 U/L — ABNORMAL HIGH (ref 15–41)
AST: 46 U/L — ABNORMAL HIGH (ref 15–41)
Albumin: 2.9 g/dL — ABNORMAL LOW (ref 3.5–5.0)
Albumin: 2.7 g/dL — ABNORMAL LOW (ref 3.5–5.0)
Albumin: 2.6 g/dL — ABNORMAL LOW (ref 3.5–5.0)
Albumin: 2.7 g/dL — ABNORMAL LOW (ref 3.5–5.0)
Albumin: 2.6 g/dL — ABNORMAL LOW (ref 3.5–5.0)
Alkaline Phosphatase: 63 U/L (ref 38–126)
Alkaline Phosphatase: 42 U/L (ref 38–126)
Alkaline Phosphatase: 41 U/L (ref 38–126)
Alkaline Phosphatase: 43 U/L (ref 38–126)
Alkaline Phosphatase: 39 U/L (ref 38–126)
Bilirubin, Direct: 0.8 mg/dL — ABNORMAL HIGH (ref 0.0–0.2)
Bilirubin, Direct: 0.5 mg/dL — ABNORMAL HIGH (ref 0.0–0.2)
Bilirubin, Direct: 0.3 mg/dL — ABNORMAL HIGH (ref 0.0–0.2)
Bilirubin, Direct: 0.4 mg/dL — ABNORMAL HIGH (ref 0.0–0.2)
Bilirubin, Direct: 0.4 mg/dL — ABNORMAL HIGH (ref 0.0–0.2)
Indirect Bilirubin: 1.2 mg/dL — ABNORMAL HIGH (ref 0.3–0.9)
Indirect Bilirubin: 0.6 mg/dL (ref 0.3–0.9)
Indirect Bilirubin: 0.7 mg/dL (ref 0.3–0.9)
Indirect Bilirubin: 0.5 mg/dL (ref 0.3–0.9)
Indirect Bilirubin: 0.8 mg/dL (ref 0.3–0.9)
Total Bilirubin: 2 mg/dL — ABNORMAL HIGH (ref <1.2)
Total Bilirubin: 1.1 mg/dL (ref <1.2)
Total Bilirubin: 1 mg/dL (ref <1.2)
Total Bilirubin: 0.9 mg/dL (ref <1.2)
Total Bilirubin: 1.2 mg/dL — ABNORMAL HIGH (ref <1.2)
Total Protein: 5.6 g/dL — ABNORMAL LOW (ref 6.5–8.1)
Total Protein: 5.8 g/dL — ABNORMAL LOW (ref 6.5–8.1)
Total Protein: 5.9 g/dL — ABNORMAL LOW (ref 6.5–8.1)
Total Protein: 5.7 g/dL — ABNORMAL LOW (ref 6.5–8.1)

## 2023-10-06 LAB — MAGNESIUM
Magnesium: 3.1 mg/dL — ABNORMAL HIGH (ref 1.7–2.4)
Magnesium: 2.2 mg/dL (ref 1.7–2.4)
Magnesium: 2.2 mg/dL (ref 1.7–2.4)
Magnesium: 2.3 mg/dL (ref 1.7–2.4)
Magnesium: 2.3 mg/dL (ref 1.7–2.4)

## 2023-10-06 LAB — URINALYSIS, ROUTINE W REFLEX MICROSCOPIC
Bilirubin Urine: NEGATIVE
Glucose, UA: NEGATIVE mg/dL
Ketones, ur: NEGATIVE mg/dL
Leukocytes,Ua: NEGATIVE
Nitrite: NEGATIVE
Protein, ur: NEGATIVE mg/dL
Specific Gravity, Urine: 1.006 (ref 1.005–1.030)
pH: 6 (ref 5.0–8.0)

## 2023-10-06 LAB — SARS CORONAVIRUS 2 (TAT 6-24 HRS): SARS Coronavirus 2: NEGATIVE

## 2023-10-06 LAB — CBC
HCT: 34.4 % — ABNORMAL LOW (ref 39.0–52.0)
HCT: 34.1 % — ABNORMAL LOW (ref 39.0–52.0)
HCT: 35 % — ABNORMAL LOW (ref 39.0–52.0)
HCT: 34.5 % — ABNORMAL LOW (ref 39.0–52.0)
Hemoglobin: 11.1 g/dL — ABNORMAL LOW (ref 13.0–17.0)
Hemoglobin: 11.1 g/dL — ABNORMAL LOW (ref 13.0–17.0)
Hemoglobin: 11.2 g/dL — ABNORMAL LOW (ref 13.0–17.0)
Hemoglobin: 11.2 g/dL — ABNORMAL LOW (ref 13.0–17.0)
MCH: 31.1 pg (ref 26.0–34.0)
MCH: 31.4 pg (ref 26.0–34.0)
MCH: 30.4 pg (ref 26.0–34.0)
MCH: 30.9 pg (ref 26.0–34.0)
MCHC: 32.3 g/dL (ref 30.0–36.0)
MCHC: 32.6 g/dL (ref 30.0–36.0)
MCHC: 32 g/dL (ref 30.0–36.0)
MCHC: 32.5 g/dL (ref 30.0–36.0)
MCV: 96.4 fL (ref 80.0–100.0)
MCV: 96.3 fL (ref 80.0–100.0)
MCV: 95.1 fL (ref 80.0–100.0)
MCV: 95 fL (ref 80.0–100.0)
Platelets: 117 10*3/uL — ABNORMAL LOW (ref 150–400)
Platelets: 110 10*3/uL — ABNORMAL LOW (ref 150–400)
Platelets: 114 10*3/uL — ABNORMAL LOW (ref 150–400)
Platelets: 109 10*3/uL — ABNORMAL LOW (ref 150–400)
RBC: 3.57 MIL/uL — ABNORMAL LOW (ref 4.22–5.81)
RBC: 3.54 MIL/uL — ABNORMAL LOW (ref 4.22–5.81)
RBC: 3.68 MIL/uL — ABNORMAL LOW (ref 4.22–5.81)
RBC: 3.63 MIL/uL — ABNORMAL LOW (ref 4.22–5.81)
RDW: 14.6 % (ref 11.5–15.5)
RDW: 14.7 % (ref 11.5–15.5)
RDW: 14.7 % (ref 11.5–15.5)
RDW: 14.7 % (ref 11.5–15.5)
WBC: 10 10*3/uL (ref 4.0–10.5)
WBC: 9.1 10*3/uL (ref 4.0–10.5)
WBC: 9.3 10*3/uL (ref 4.0–10.5)
WBC: 10.4 10*3/uL (ref 4.0–10.5)
nRBC: 0.7 % — ABNORMAL HIGH (ref 0.0–0.2)
nRBC: 0.3 % — ABNORMAL HIGH (ref 0.0–0.2)
nRBC: 0.3 % — ABNORMAL HIGH (ref 0.0–0.2)
nRBC: 0.4 % — ABNORMAL HIGH (ref 0.0–0.2)

## 2023-10-06 LAB — PHOSPHORUS
Phosphorus: 3.4 mg/dL (ref 2.5–4.6)
Phosphorus: 4 mg/dL (ref 2.5–4.6)
Phosphorus: 2.6 mg/dL (ref 2.5–4.6)
Phosphorus: 3 mg/dL (ref 2.5–4.6)
Phosphorus: 2.2 mg/dL — ABNORMAL LOW (ref 2.5–4.6)

## 2023-10-06 LAB — BASIC METABOLIC PANEL
Anion gap: 9 (ref 5–15)
Anion gap: 5 (ref 5–15)
Anion gap: 6 (ref 5–15)
Anion gap: 6 (ref 5–15)
Anion gap: 5 (ref 5–15)
BUN: 25 mg/dL — ABNORMAL HIGH (ref 6–20)
BUN: 26 mg/dL — ABNORMAL HIGH (ref 6–20)
BUN: 24 mg/dL — ABNORMAL HIGH (ref 6–20)
BUN: 23 mg/dL — ABNORMAL HIGH (ref 6–20)
BUN: 29 mg/dL — ABNORMAL HIGH (ref 6–20)
CO2: 19 mmol/L — ABNORMAL LOW (ref 22–32)
CO2: 22 mmol/L (ref 22–32)
CO2: 23 mmol/L (ref 22–32)
CO2: 23 mmol/L (ref 22–32)
CO2: 23 mmol/L (ref 22–32)
Calcium: 8.6 mg/dL — ABNORMAL LOW (ref 8.9–10.3)
Calcium: 8.3 mg/dL — ABNORMAL LOW (ref 8.9–10.3)
Calcium: 8.7 mg/dL — ABNORMAL LOW (ref 8.9–10.3)
Calcium: 8.7 mg/dL — ABNORMAL LOW (ref 8.9–10.3)
Calcium: 8.6 mg/dL — ABNORMAL LOW (ref 8.9–10.3)
Chloride: 120 mmol/L — ABNORMAL HIGH (ref 98–111)
Chloride: 124 mmol/L — ABNORMAL HIGH (ref 98–111)
Chloride: 127 mmol/L — ABNORMAL HIGH (ref 98–111)
Chloride: 130 mmol/L — ABNORMAL HIGH (ref 98–111)
Chloride: 129 mmol/L — ABNORMAL HIGH (ref 98–111)
Creatinine, Ser: 1.28 mg/dL — ABNORMAL HIGH (ref 0.61–1.24)
Creatinine, Ser: 1.71 mg/dL — ABNORMAL HIGH (ref 0.61–1.24)
Creatinine, Ser: 1.69 mg/dL — ABNORMAL HIGH (ref 0.61–1.24)
Creatinine, Ser: 1.67 mg/dL — ABNORMAL HIGH (ref 0.61–1.24)
Creatinine, Ser: 1.8 mg/dL — ABNORMAL HIGH (ref 0.61–1.24)
GFR, Estimated: >60 mL/min (ref >60)
GFR, Estimated: 46 mL/min — ABNORMAL LOW (ref >60)
GFR, Estimated: 47 mL/min — ABNORMAL LOW (ref >60)
GFR, Estimated: 48 mL/min — ABNORMAL LOW (ref >60)
GFR, Estimated: 44 mL/min — ABNORMAL LOW (ref >60)
Glucose, Bld: 158 mg/dL — ABNORMAL HIGH (ref 70–99)
Glucose, Bld: 246 mg/dL — ABNORMAL HIGH (ref 70–99)
Glucose, Bld: 137 mg/dL — ABNORMAL HIGH (ref 70–99)
Glucose, Bld: 97 mg/dL (ref 70–99)
Glucose, Bld: 90 mg/dL (ref 70–99)
Potassium: 5.4 mmol/L — ABNORMAL HIGH (ref 3.5–5.1)
Potassium: 4 mmol/L (ref 3.5–5.1)
Potassium: 3.7 mmol/L (ref 3.5–5.1)
Potassium: 3.7 mmol/L (ref 3.5–5.1)
Potassium: 3.2 mmol/L — ABNORMAL LOW (ref 3.5–5.1)
Sodium: 148 mmol/L — ABNORMAL HIGH (ref 135–145)
Sodium: 151 mmol/L — ABNORMAL HIGH (ref 135–145)
Sodium: 156 mmol/L — ABNORMAL HIGH (ref 135–145)
Sodium: 159 mmol/L — ABNORMAL HIGH (ref 135–145)
Sodium: 157 mmol/L — ABNORMAL HIGH (ref 135–145)

## 2023-10-06 LAB — APTT
aPTT: 50 s — ABNORMAL HIGH (ref 24–36)
aPTT: 61 s — ABNORMAL HIGH (ref 24–36)
aPTT: 58 s — ABNORMAL HIGH (ref 24–36)
aPTT: 53 s — ABNORMAL HIGH (ref 24–36)

## 2023-10-06 LAB — PROTIME-INR
INR: 1.2 (ref 0.8–1.2)
INR: 1.2 (ref 0.8–1.2)
INR: 1.2 (ref 0.8–1.2)
INR: 1.2 (ref 0.8–1.2)
Prothrombin Time: 15.6 s — ABNORMAL HIGH (ref 11.4–15.2)
Prothrombin Time: 15.4 s — ABNORMAL HIGH (ref 11.4–15.2)
Prothrombin Time: 15.7 s — ABNORMAL HIGH (ref 11.4–15.2)
Prothrombin Time: 15.7 s — ABNORMAL HIGH (ref 11.4–15.2)

## 2023-10-06 LAB — COOXEMETRY PANEL
Carboxyhemoglobin: 1.5 % (ref 0.5–1.5)
Methemoglobin: <0.7 % (ref 0.0–1.5)
O2 Saturation: 86.9 %
Total hemoglobin: 11.6 g/dL — ABNORMAL LOW (ref 12.0–16.0)

## 2023-10-06 LAB — CK TOTAL AND CKMB (NOT AT ARMC)
CK, MB: 8.5 ng/mL — ABNORMAL HIGH (ref 0.5–5.0)
Total CK: 464 U/L — ABNORMAL HIGH (ref 49–397)

## 2023-10-06 LAB — LACTIC ACID, PLASMA
Lactic Acid, Venous: 0.9 mmol/L (ref 0.5–1.9)
Lactic Acid, Venous: 1 mmol/L (ref 0.5–1.9)

## 2023-10-06 LAB — TYPE AND SCREEN

## 2023-10-06 LAB — TROPONIN I (HIGH SENSITIVITY)
Troponin I (High Sensitivity): 407 ng/L (ref <18)
Troponin I (High Sensitivity): 405 ng/L (ref <18)

## 2023-10-06 LAB — TRIGLYCERIDES: Triglycerides: 176 mg/dL — ABNORMAL HIGH (ref <150)

## 2023-10-06 LAB — GLUCOSE, CAPILLARY: Glucose-Capillary: 203 mg/dL — ABNORMAL HIGH (ref 70–99)

## 2023-10-06 MED ORDER — SODIUM CHLORIDE 0.9 % IV BOLUS
500.0000 mL | Freq: Once | INTRAVENOUS | Status: AC
  Administered 2023-10-06: 500 mL via INTRAVENOUS

## 2023-10-06 MED ORDER — PIPERACILLIN-TAZOBACTAM 3.375 G IVPB
3.3750 g | Freq: Three times a day (TID) | INTRAVENOUS | Status: DC
  Administered 2023-10-06 – 2023-10-09 (×9): 3.375 g via INTRAVENOUS
  Filled 2023-10-06 (×9): qty 50

## 2023-10-06 MED ORDER — VASOPRESSIN 20 UNITS/100 ML INFUSION FOR SHOCK: 0.0000 [IU]/min | INTRAVENOUS | Status: DC

## 2023-10-06 MED ORDER — NOREPINEPHRINE 4 MG/250ML-% IV SOLN
0.0000 ug/min | INTRAVENOUS | Status: DC
  Administered 2023-10-06: 32 ug/min via INTRAVENOUS
  Administered 2023-10-06: 7 ug/min via INTRAVENOUS
  Administered 2023-10-07: 8 ug/min via INTRAVENOUS
  Filled 2023-10-06 (×3): qty 250

## 2023-10-06 MED ORDER — FREE WATER
200.0000 mL | Status: DC
  Administered 2023-10-06 – 2023-10-07 (×11): 200 mL

## 2023-10-06 MED ORDER — ALBUMIN HUMAN 25 % IV SOLN
25.0000 g | Freq: Once | INTRAVENOUS | Status: AC
  Administered 2023-10-06: 12.5 g via INTRAVENOUS
  Filled 2023-10-06: qty 100

## 2023-10-06 MED ORDER — GLYCOPYRROLATE 0.2 MG/ML IJ SOLN: 0.1000 mg | Freq: Once | INTRAMUSCULAR | Status: DC

## 2023-10-06 MED ORDER — NOREPINEPHRINE 4 MG/250ML-% IV SOLN: 2.0000 ug/min | INTRAVENOUS | Status: DC

## 2023-10-06 MED ORDER — SODIUM CHLORIDE 0.9 % IV SOLN
20.0000 ug/h | INTRAVENOUS | Status: DC
  Administered 2023-10-06 – 2023-10-09 (×6): 20 ug/h via INTRAVENOUS
  Filled 2023-10-06 (×8): qty 10

## 2023-10-06 MED ORDER — NOREPINEPHRINE 4 MG/250ML-% IV SOLN
INTRAVENOUS | Status: AC
  Filled 2023-10-06: qty 250

## 2023-10-06 MED ORDER — GLYCOPYRROLATE 0.2 MG/ML IJ SOLN
0.2000 mg | Freq: Once | INTRAMUSCULAR | Status: AC
  Administered 2023-10-06: 0.2 mg via INTRAVENOUS
  Filled 2023-10-06: qty 1

## 2023-10-06 MED ORDER — LACTATED RINGERS IV SOLN: INTRAVENOUS | Status: DC

## 2023-10-06 MED ORDER — SODIUM CHLORIDE 0.9 % IV SOLN: 250.0000 mL | INTRAVENOUS | Status: DC

## 2023-10-06 NOTE — Progress Notes (Signed)
Contacted Medical Examiner [facility]) reports no indication of a ME case. Reports [employer] can continue their process.

## 2023-10-06 NOTE — Progress Notes (Signed)
Critical Troponin 407.  Trending down.  Notified Forensic psychologist and CCM.

## 2023-10-06 NOTE — Progress Notes (Signed)
NAME:  Ryan Silva, MRN:  [id number], DOB:  Oct 12, 1966, LOS: 4 ADMISSION DATE:  10/02/2023, CONSULTATION DATE: 10/02/2023 REFERRING MD:  Rolla Flatten, MD , CHIEF COMPLAINT: Unresponsiveness  History of Present Illness:  58 year old male who was brought into the emergency department after he was found down by the bedside by his wife.  His last known well was 4 PM yesterday, upon arrival to ED patient was noted to be posturing, his GCS was 3 with systolic blood pressure ranging in 300s.  He was noted to have pinpoint pupils, he was given Narcan without much improvement, he was noted to vomiting from the mouth with a left gaze, he was immediately intubated.  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 CTA abdomen/pelvis/head and neck showed no aneurysm or aortic dissection. Patient was evaluated by neurosurgery, EVD was placed, evaluated by neurology, recommend admission to ICU  Pertinent  Medical History  Hypertension  Significant Hospital Events: Including procedures, antibiotic start and stop dates in addition to other pertinent events   11/22 EVD placed EEG 11/23 >> severe encephalopathy without any evidence of seizures or epileptiform discharges Echocardiogram 11/23 > apical cavity obliteration, LVEF 70-75% with moderate concentric LVH and grade 2 diastolic dysfunction.  RV size and function are normal  Interim History / Subjective:  Off continuous sedation. Not responsive.    Objective   Blood pressure 106/62, pulse 62, temperature 97.9 F (36.6 C), resp. rate 20, height 5\' 10"  (1.778 m), weight 69.4 kg, SpO2 96%.    Vent Mode: PRVC FiO2 (%):  [40 %-100 %] 40 % Set Rate:  [18 bmp-20 bmp] 20 bmp Vt Set:  [440 mL] 440 mL PEEP:  [5 cmH20-8 cmH20] 8 cmH20 Plateau Pressure:  [12 cmH20-13 cmH20] 13 cmH20    Intake/Output Summary (Last 24 hours) at 10/06/2023 0820 Last data filed at 10/06/2023 0800 Gross per 24 hour  Intake 2841.89 ml  Output 1212 ml  Net 1629.89 ml   Filed Weights   10/04/23 0443 10/05/23 0500 10/06/23 0700  Weight: 71 kg 69.4 kg 69.4 kg    Examination: Gen:      Intubated, acutely ill appearing HEENT:  ETT to vent, EVD in place with serosanguinous output Lungs:    sounds of mechanical ventilation auscultated no wheeze CV:         RRR Abd:      + bowel sounds; soft, non-tender; no palpable masses, no distension Ext:    No edema Skin:      Warm and dry; no rashes Neuro:   [facility] Problem list   As above  Assessment & Plan:  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, ICH score of 4, with expected mortality 97% Severe cerebral edema with brain compression and midline shift Brain herniation syndrome Hypertensive emergency Acute obstructive hydrocephalus Possible seizure activity 11/23 Acute respiratory failure requiring mechanical ventilation due to the above Hypernatremia  Plans: -Appreciate neurosurgery management, EVD in place and operating -Adjust hypertonic saline for Na goal 150-155 -All anticoagulation held -SBP goal 100-150, on amlodipine 10 mg, carvedilol 25 twice daily, Cleviprex available -Keppra prophylaxis as ordered - suspect progression to brain death given clinical picture and exam. Has been off propofol for over 48 hours and off fentanyl since yesterday afternoon.  - ABG shows mild hypercapnia, RR increased from 18 to 20 to 22 with repeat  ABG pending. Labs reviewed no major electrolyte disturbances to explain encephalopathy (Na 151, BUN 26) - patient is appropriate for brain death examination with apnea testing. Will proceed with apnea testing later this morning  Best Practice (right click and "Reselect all SmartList Selections" daily)   Diet/type: NPO DVT prophylaxis: heparin  injection 5,000 Units Start: 10/04/23 0600 SCDs Start: 10/02/23 1129   Pressure ulcer(s): not present on admission  GI prophylaxis: PPI Lines: N/A Foley:  Yes, and it is still needed Code Status:  full code Last date of multidisciplinary goals of care discussion [11/25 -family has elected to proceed with organ donation process]  The patient is critically ill due to stroke, respiratory failure.  Critical care was necessary to treat or prevent imminent or life-threatening deterioration.  Critical care was time spent personally by me on the following activities: development of treatment plan with patient and/or surrogate as well as nursing, discussions with consultants, evaluation of patient's response to treatment, examination of patient, obtaining history from patient or surrogate, ordering and performing treatments and interventions, ordering and review of laboratory studies, ordering and review of radiographic studies, pulse oximetry, re-evaluation of patient's condition and participation in multidisciplinary rounds.   Critical Care Time devoted to patient care services described in this note is 31 minutes. This time reflects time of care of this signee Charlott Holler . This critical care time does not reflect separately billable procedures or procedure time, teaching time or supervisory time of PA/NP/Med student/Med Resident etc but could involve care discussion time.       Charlott Holler [place] [facility] 10/06/2023 8:26 AM  Pager: see AMION  If no response to pager , please call critical care on call (see AMION) until 7pm After 7:00 pm call Elink

## 2023-10-06 NOTE — Progress Notes (Signed)
  Recruitment maneuver done per donor services order placed pt on 20 of peep for 30 seconds pt tolerated abg to follow in 30 mintues

## 2023-10-06 NOTE — Procedures (Signed)
Bronchoscopy Procedure Note  Aleix Karmel  [id number]  February 24, 1966  Date:10/06/23  Time:11:43 AM   Provider Performing:Kiesha Ensey S Celine Mans   Procedure(s):  Flexible bronchoscopy with bronchial alveolar lavage (78295)  Indication(s) Donor bronchoscopy  Consent Risks of the procedure as well as the alternatives and risks of each were explained to the patient and/or caregiver.  Consent for the procedure was obtained and is signed in the bedside chart  Anesthesia none   Time Out Verified patient identification, verified procedure, site/side was marked, verified correct patient position, special equipment/implants available, medications/allergies/relevant history reviewed, required imaging and test results available.   Sterile Technique Usual hand hygiene, masks, gowns, and gloves were used   Procedure Description Bronchoscope advanced through endotracheal tube and into airway.  Airways were examined down to subsegmental level with findings noted below.   Following diagnostic evaluation, BAL(s) performed in 200 with normal saline and return of 60 fluid  Findings: normal bronchial anatomy. Normal mucosa. No endobronchial lesions. BALx2 taken from RML subsegments.   Complications/Tolerance None; patient tolerated the procedure well. Chest X-ray is not needed post procedure.   EBL none   Specimen(s) BAL  Durel Salts, MD Pulmonary and Critical Care Medicine [facility] 10/06/2023 11:44 AM Pager: see AMION  If no response to pager, please call critical care on call (see AMION) until 7pm After 7:00 pm call Elink

## 2023-10-06 NOTE — Plan of Care (Signed)
Noted that pt is in comfort care now with potential donor candidate. CCM is working on that. Neurology will sign off for now. Please feel to call with any questions.   Marvel Plan, MD PhD Stroke Neurology 10/06/2023 2:08 PM

## 2023-10-06 NOTE — Progress Notes (Signed)
Pharmacy Antibiotic Note  Ryan Silva is a 57 y.o. male admitted on 10/02/2023 with large left basal ganglia hemorrhage, with intraventricular extension.  Pharmacy has been consulted for Zosyn dosing per [facility] request for elevated coox rule out sepsis .  Plan: Zosyn 3.375gm IV q8h (4hr extended infusions)  Height: 5\' 10"  (177.8 cm) Weight: 69.4 kg (153 lb) IBW/kg (Calculated) : 73  Temp (24hrs), Avg:98 F (36.7 C), Min:96.4 F (35.8 C), Max:100 F (37.8 C)  Recent Labs  Lab 10/04/23 0432 10/04/23 0740 10/05/23 0518 10/05/23 2322 10/06/23 0449 10/06/23 1030  WBC 18.2*  --  16.3* 13.2* 10.0 9.1  CREATININE  --  1.45* 1.42* 1.28* 1.71* 1.69*    Estimated Creatinine Clearance: 47.9 mL/min (A) (by C-G formula based on SCr of 1.69 mg/dL (H)).    No Known Allergies  Antimicrobials this admission: 11/26 Zosyn >>  Dose adjustments this admission:  Microbiology results: 11/25 BCx: 11/26 BAL:  Thank you for allowing pharmacy to be a part of this patient's care.  Loralee Pacas, PharmD, BCPS 10/06/2023 5:25 PM  Please check AMION for all [facility] Pharmacy phone numbers After 10:00 PM, call Main Pharmacy [phone]

## 2023-10-06 NOTE — Progress Notes (Signed)
At approximately 0000 the patient's SBP dropped to the 70s and HR to the 40s. Patient had strong femoral pulses. Patient's pupils were noted to blown, non reactive. Patient no longer had a cough and absent corneal reflexes. Elink was called in the room. Levo started and fluid bolus administered. Dr. Marlane Mingle advised to continue treating the patient, as the patient is still a full code.   CCM at the bedside. Central line placed.   Journalist, newspaper at the bedside, Curator of the patient's condition.    Dr. Iver Nestle with neurology updated of the same.

## 2023-10-06 NOTE — Progress Notes (Signed)
Bronchoscopy done. Patient has not been off fentanyl for at least 24 hours ( has been off since midnight.) ideally would want to wait 24 hours after all sedation off before proceeding with apnea testing. Will proceed with nuclear medicine scan for confirmatory testing.   Durel Salts, MD Pulmonary and Critical Care Medicine [place] 10/06/2023 2:17 PM Pager: see AMION  If no response to pager, please call critical care on call (see AMION) until 7pm After 7:00 pm call Elink

## 2023-10-06 NOTE — Progress Notes (Signed)
RT completed O2 challenge per honor bridge. O2 challenge: increase FiO2 to 100% and decrease PEEP to 5.  RT obtained ABG 30 minutes after start of O2 challenge and reported results to honor bridge.  7.39/41.2/414/25.2  RT decreased FiO2 back to 40% and turned PEEP up to 10 (previously 8) per honor bridge and MD.   Per honor bridge O2 challenges should continue Q4.   RT will continue to monitor.

## 2023-10-06 NOTE — Procedures (Signed)
Central Venous Catheter Insertion Procedure Note  Ryan Silva  [id number]  July 29, 1966  Date:10/06/23  Time:1:22 AM   Provider Performing:Elzada Pytel Judie Petit Pecola Leisure   Procedure: Insertion of Non-tunneled Central Venous Catheter(36556) with US guidance ([id number])   Indication(s) Medication administration  Consent Unable to obtain consent due to emergent nature of procedure.  Anesthesia Topical only with 1% lidocaine   Timeout Verified patient identification, verified procedure, site/side was marked, verified correct patient position, special equipment/implants available, medications/allergies/relevant history reviewed, required imaging and test results available.  Sterile Technique Maximal sterile technique including full sterile barrier drape, hand hygiene, sterile gown, sterile gloves, mask, hair covering, sterile ultrasound probe cover (if used).  Procedure Description Area of catheter insertion was cleaned with chlorhexidine and draped in sterile fashion.  With real-time ultrasound guidance a central venous catheter was placed into the left internal jugular vein. Nonpulsatile blood flow and easy flushing noted in all ports.  The catheter was sutured in place and sterile dressing applied.    Complications/Tolerance None; patient tolerated the procedure well. Chest X-ray is ordered to verify placement for internal jugular or subclavian cannulation.   Chest x-ray is not ordered for femoral cannulation.  EBL Minimal  Specimen(s) None  Tim Lair, PA-C [facility] 10/06/23 1:24 AM  Please see [email or web address] for pager details.  From 7A-7P if no response, please call [phone] After hours, please call ELink [phone]

## 2023-10-06 NOTE — Progress Notes (Signed)
RT assisted MD with bronchoscopy. No complications. RT will continue to monitor.

## 2023-10-07 ENCOUNTER — Inpatient Hospital Stay (HOSPITAL_COMMUNITY): Payer: Commercial Managed Care - HMO

## 2023-10-07 ENCOUNTER — Other Ambulatory Visit (HOSPITAL_COMMUNITY): Payer: Commercial Managed Care - HMO

## 2023-10-07 DIAGNOSIS — I161 Hypertensive emergency: Secondary | ICD-10-CM | POA: Diagnosis not present

## 2023-10-07 DIAGNOSIS — G936 Cerebral edema: Secondary | ICD-10-CM | POA: Diagnosis not present

## 2023-10-07 DIAGNOSIS — J9601 Acute respiratory failure with hypoxia: Secondary | ICD-10-CM | POA: Diagnosis not present

## 2023-10-07 DIAGNOSIS — I615 Nontraumatic intracerebral hemorrhage, intraventricular: Secondary | ICD-10-CM | POA: Diagnosis not present

## 2023-10-07 LAB — HEPATIC FUNCTION PANEL
ALT: 24 U/L (ref 0–44)
ALT: 25 U/L (ref 0–44)
ALT: 24 U/L (ref 0–44)
ALT: 26 U/L (ref 0–44)
AST: 42 U/L — ABNORMAL HIGH (ref 15–41)
AST: 44 U/L — ABNORMAL HIGH (ref 15–41)
AST: 42 U/L — ABNORMAL HIGH (ref 15–41)
AST: 44 U/L — ABNORMAL HIGH (ref 15–41)
Albumin: 2.4 g/dL — ABNORMAL LOW (ref 3.5–5.0)
Albumin: 2.5 g/dL — ABNORMAL LOW (ref 3.5–5.0)
Albumin: 2.4 g/dL — ABNORMAL LOW (ref 3.5–5.0)
Albumin: 2.5 g/dL — ABNORMAL LOW (ref 3.5–5.0)
Alkaline Phosphatase: 38 U/L (ref 38–126)
Alkaline Phosphatase: 44 U/L (ref 38–126)
Alkaline Phosphatase: 47 U/L (ref 38–126)
Alkaline Phosphatase: 46 U/L (ref 38–126)
Bilirubin, Direct: 0.5 mg/dL — ABNORMAL HIGH (ref 0.0–0.2)
Bilirubin, Direct: 0.6 mg/dL — ABNORMAL HIGH (ref 0.0–0.2)
Bilirubin, Direct: 0.7 mg/dL — ABNORMAL HIGH (ref 0.0–0.2)
Bilirubin, Direct: 0.9 mg/dL — ABNORMAL HIGH (ref 0.0–0.2)
Indirect Bilirubin: 0.6 mg/dL (ref 0.3–0.9)
Indirect Bilirubin: 0.7 mg/dL (ref 0.3–0.9)
Indirect Bilirubin: 0.6 mg/dL (ref 0.3–0.9)
Indirect Bilirubin: 0.6 mg/dL (ref 0.3–0.9)
Total Bilirubin: 1.1 mg/dL (ref <1.2)
Total Bilirubin: 1.3 mg/dL — ABNORMAL HIGH (ref <1.2)
Total Bilirubin: 1.3 mg/dL — ABNORMAL HIGH (ref <1.2)
Total Bilirubin: 1.5 mg/dL — ABNORMAL HIGH (ref <1.2)
Total Protein: 5.6 g/dL — ABNORMAL LOW (ref 6.5–8.1)
Total Protein: 5.6 g/dL — ABNORMAL LOW (ref 6.5–8.1)
Total Protein: 5.6 g/dL — ABNORMAL LOW (ref 6.5–8.1)
Total Protein: 5.9 g/dL — ABNORMAL LOW (ref 6.5–8.1)

## 2023-10-07 LAB — POCT I-STAT 7, (LYTES, BLD GAS, ICA,H+H)
Acid-base deficit: 1 mmol/L (ref 0.0–2.0)
Acid-base deficit: 2 mmol/L (ref 0.0–2.0)
Acid-base deficit: 3 mmol/L — ABNORMAL HIGH (ref 0.0–2.0)
Acid-base deficit: 3 mmol/L — ABNORMAL HIGH (ref 0.0–2.0)
Acid-base deficit: 3 mmol/L — ABNORMAL HIGH (ref 0.0–2.0)
Acid-base deficit: 6 mmol/L — ABNORMAL HIGH (ref 0.0–2.0)
Bicarbonate: 19.7 mmol/L — ABNORMAL LOW (ref 20.0–28.0)
Bicarbonate: 22.2 mmol/L (ref 20.0–28.0)
Bicarbonate: 23.5 mmol/L (ref 20.0–28.0)
Bicarbonate: 23.5 mmol/L (ref 20.0–28.0)
Bicarbonate: 23.7 mmol/L (ref 20.0–28.0)
Bicarbonate: 24.6 mmol/L (ref 20.0–28.0)
Calcium, Ion: 1.15 mmol/L (ref 1.15–1.40)
Calcium, Ion: 1.21 mmol/L (ref 1.15–1.40)
Calcium, Ion: 1.27 mmol/L (ref 1.15–1.40)
Calcium, Ion: 1.28 mmol/L (ref 1.15–1.40)
Calcium, Ion: 1.29 mmol/L (ref 1.15–1.40)
Calcium, Ion: 1.31 mmol/L (ref 1.15–1.40)
HCT: 27 % — ABNORMAL LOW (ref 39.0–52.0)
HCT: 29 % — ABNORMAL LOW (ref 39.0–52.0)
HCT: 30 % — ABNORMAL LOW (ref 39.0–52.0)
HCT: 31 % — ABNORMAL LOW (ref 39.0–52.0)
HCT: 31 % — ABNORMAL LOW (ref 39.0–52.0)
HCT: 32 % — ABNORMAL LOW (ref 39.0–52.0)
Hemoglobin: 10.2 g/dL — ABNORMAL LOW (ref 13.0–17.0)
Hemoglobin: 10.5 g/dL — ABNORMAL LOW (ref 13.0–17.0)
Hemoglobin: 10.5 g/dL — ABNORMAL LOW (ref 13.0–17.0)
Hemoglobin: 10.9 g/dL — ABNORMAL LOW (ref 13.0–17.0)
Hemoglobin: 9.2 g/dL — ABNORMAL LOW (ref 13.0–17.0)
Hemoglobin: 9.9 g/dL — ABNORMAL LOW (ref 13.0–17.0)
O2 Saturation: 100 %
O2 Saturation: 100 %
O2 Saturation: 100 %
O2 Saturation: 100 %
O2 Saturation: 100 %
O2 Saturation: 100 %
Patient temperature: 36.8
Patient temperature: 37.1
Patient temperature: 37.1
Patient temperature: 37.2
Patient temperature: 37.5
Patient temperature: 97.2
Potassium: 2.3 mmol/L — CL (ref 3.5–5.1)
Potassium: 2.5 mmol/L — CL (ref 3.5–5.1)
Potassium: 2.6 mmol/L — CL (ref 3.5–5.1)
Potassium: 2.9 mmol/L — ABNORMAL LOW (ref 3.5–5.1)
Potassium: 3 mmol/L — ABNORMAL LOW (ref 3.5–5.1)
Potassium: 3.2 mmol/L — ABNORMAL LOW (ref 3.5–5.1)
Sodium: 163 mmol/L (ref 135–145)
Sodium: 165 mmol/L (ref 135–145)
Sodium: 166 mmol/L (ref 135–145)
Sodium: 166 mmol/L (ref 135–145)
Sodium: 166 mmol/L (ref 135–145)
Sodium: 166 mmol/L (ref 135–145)
TCO2: 21 mmol/L — ABNORMAL LOW (ref 22–32)
TCO2: 23 mmol/L (ref 22–32)
TCO2: 25 mmol/L (ref 22–32)
TCO2: 25 mmol/L (ref 22–32)
TCO2: 25 mmol/L (ref 22–32)
TCO2: 26 mmol/L (ref 22–32)
pCO2 arterial: 39.1 mm[Hg] (ref 32–48)
pCO2 arterial: 40.7 mm[Hg] (ref 32–48)
pCO2 arterial: 42.7 mm[Hg] (ref 32–48)
pCO2 arterial: 44.6 mm[Hg] (ref 32–48)
pCO2 arterial: 46.7 mm[Hg] (ref 32–48)
pCO2 arterial: 48.1 mm[Hg] — ABNORMAL HIGH (ref 32–48)
pH, Arterial: 7.294 — ABNORMAL LOW (ref 7.35–7.45)
pH, Arterial: 7.304 — ABNORMAL LOW (ref 7.35–7.45)
pH, Arterial: 7.306 — ABNORMAL LOW (ref 7.35–7.45)
pH, Arterial: 7.33 — ABNORMAL LOW (ref 7.35–7.45)
pH, Arterial: 7.362 (ref 7.35–7.45)
pH, Arterial: 7.369 (ref 7.35–7.45)
pO2, Arterial: 340 mm[Hg] — ABNORMAL HIGH (ref 83–108)
pO2, Arterial: 364 mm[Hg] — ABNORMAL HIGH (ref 83–108)
pO2, Arterial: 374 mm[Hg] — ABNORMAL HIGH (ref 83–108)
pO2, Arterial: 397 mm[Hg] — ABNORMAL HIGH (ref 83–108)
pO2, Arterial: 462 mm[Hg] — ABNORMAL HIGH (ref 83–108)
pO2, Arterial: 466 mm[Hg] — ABNORMAL HIGH (ref 83–108)

## 2023-10-07 LAB — CBC
HCT: 34.7 % — ABNORMAL LOW (ref 39.0–52.0)
HCT: 36.1 % — ABNORMAL LOW (ref 39.0–52.0)
HCT: 36.3 % — ABNORMAL LOW (ref 39.0–52.0)
HCT: 35.9 % — ABNORMAL LOW (ref 39.0–52.0)
Hemoglobin: 11 g/dL — ABNORMAL LOW (ref 13.0–17.0)
Hemoglobin: 11.5 g/dL — ABNORMAL LOW (ref 13.0–17.0)
Hemoglobin: 11.3 g/dL — ABNORMAL LOW (ref 13.0–17.0)
Hemoglobin: 11.5 g/dL — ABNORMAL LOW (ref 13.0–17.0)
MCH: 30.4 pg (ref 26.0–34.0)
MCH: 31.3 pg (ref 26.0–34.0)
MCH: 30.3 pg (ref 26.0–34.0)
MCH: 31.5 pg (ref 26.0–34.0)
MCHC: 31.7 g/dL (ref 30.0–36.0)
MCHC: 31.9 g/dL (ref 30.0–36.0)
MCHC: 31.1 g/dL (ref 30.0–36.0)
MCHC: 32 g/dL (ref 30.0–36.0)
MCV: 95.9 fL (ref 80.0–100.0)
MCV: 98.1 fL (ref 80.0–100.0)
MCV: 97.3 fL (ref 80.0–100.0)
MCV: 98.4 fL (ref 80.0–100.0)
Platelets: 105 10*3/uL — ABNORMAL LOW (ref 150–400)
Platelets: 101 10*3/uL — ABNORMAL LOW (ref 150–400)
Platelets: UNDETERMINED 10*3/uL (ref 150–400)
Platelets: 80 10*3/uL — ABNORMAL LOW (ref 150–400)
RBC: 3.62 MIL/uL — ABNORMAL LOW (ref 4.22–5.81)
RBC: 3.68 MIL/uL — ABNORMAL LOW (ref 4.22–5.81)
RBC: 3.73 MIL/uL — ABNORMAL LOW (ref 4.22–5.81)
RBC: 3.65 MIL/uL — ABNORMAL LOW (ref 4.22–5.81)
RDW: 14.7 % (ref 11.5–15.5)
RDW: 14.7 % (ref 11.5–15.5)
RDW: 14.9 % (ref 11.5–15.5)
RDW: 14.8 % (ref 11.5–15.5)
WBC: 11 10*3/uL — ABNORMAL HIGH (ref 4.0–10.5)
WBC: 12.2 10*3/uL — ABNORMAL HIGH (ref 4.0–10.5)
WBC: 9.3 10*3/uL (ref 4.0–10.5)
WBC: 12.1 10*3/uL — ABNORMAL HIGH (ref 4.0–10.5)
nRBC: 0.3 % — ABNORMAL HIGH (ref 0.0–0.2)
nRBC: 0.2 % (ref 0.0–0.2)
nRBC: 0.5 % — ABNORMAL HIGH (ref 0.0–0.2)
nRBC: 0.2 % (ref 0.0–0.2)

## 2023-10-07 LAB — URINALYSIS, ROUTINE W REFLEX MICROSCOPIC
Bilirubin Urine: NEGATIVE
Glucose, UA: NEGATIVE mg/dL
Ketones, ur: NEGATIVE mg/dL
Nitrite: NEGATIVE
Protein, ur: NEGATIVE mg/dL
Specific Gravity, Urine: 1.005 (ref 1.005–1.030)
pH: 5 (ref 5.0–8.0)

## 2023-10-07 LAB — BASIC METABOLIC PANEL
BUN: 29 mg/dL — ABNORMAL HIGH (ref 6–20)
BUN: 27 mg/dL — ABNORMAL HIGH (ref 6–20)
BUN: 27 mg/dL — ABNORMAL HIGH (ref 6–20)
BUN: 26 mg/dL — ABNORMAL HIGH (ref 6–20)
CO2: 24 mmol/L (ref 22–32)
CO2: 24 mmol/L (ref 22–32)
CO2: 24 mmol/L (ref 22–32)
CO2: 23 mmol/L (ref 22–32)
Calcium: 8.5 mg/dL — ABNORMAL LOW (ref 8.9–10.3)
Calcium: 8.6 mg/dL — ABNORMAL LOW (ref 8.9–10.3)
Calcium: 8.4 mg/dL — ABNORMAL LOW (ref 8.9–10.3)
Calcium: 8.4 mg/dL — ABNORMAL LOW (ref 8.9–10.3)
Chloride: >130 mmol/L (ref 98–111)
Chloride: >130 mmol/L (ref 98–111)
Chloride: >130 mmol/L (ref 98–111)
Chloride: >130 mmol/L (ref 98–111)
Creatinine, Ser: 1.81 mg/dL — ABNORMAL HIGH (ref 0.61–1.24)
Creatinine, Ser: 1.91 mg/dL — ABNORMAL HIGH (ref 0.61–1.24)
Creatinine, Ser: 1.88 mg/dL — ABNORMAL HIGH (ref 0.61–1.24)
Creatinine, Ser: 1.87 mg/dL — ABNORMAL HIGH (ref 0.61–1.24)
GFR, Estimated: 43 mL/min — ABNORMAL LOW (ref >60)
GFR, Estimated: 41 mL/min — ABNORMAL LOW (ref >60)
GFR, Estimated: 41 mL/min — ABNORMAL LOW (ref >60)
GFR, Estimated: 42 mL/min — ABNORMAL LOW (ref >60)
Glucose, Bld: 89 mg/dL (ref 70–99)
Glucose, Bld: 102 mg/dL — ABNORMAL HIGH (ref 70–99)
Glucose, Bld: 108 mg/dL — ABNORMAL HIGH (ref 70–99)
Glucose, Bld: 172 mg/dL — ABNORMAL HIGH (ref 70–99)
Potassium: 3.2 mmol/L — ABNORMAL LOW (ref 3.5–5.1)
Potassium: 2.8 mmol/L — ABNORMAL LOW (ref 3.5–5.1)
Potassium: 2.4 mmol/L — CL (ref 3.5–5.1)
Potassium: 3.2 mmol/L — ABNORMAL LOW (ref 3.5–5.1)
Sodium: 163 mmol/L (ref 135–145)
Sodium: 164 mmol/L (ref 135–145)
Sodium: 164 mmol/L (ref 135–145)
Sodium: 161 mmol/L (ref 135–145)

## 2023-10-07 LAB — LACTIC ACID, PLASMA
Lactic Acid, Venous: 0.8 mmol/L (ref 0.5–1.9)
Lactic Acid, Venous: 0.8 mmol/L (ref 0.5–1.9)
Lactic Acid, Venous: 0.9 mmol/L (ref 0.5–1.9)
Lactic Acid, Venous: 1 mmol/L (ref 0.5–1.9)

## 2023-10-07 LAB — PROTIME-INR
INR: 1.2 (ref 0.8–1.2)
INR: 1.2 (ref 0.8–1.2)
INR: 1.2 (ref 0.8–1.2)
INR: 1.2 (ref 0.8–1.2)
Prothrombin Time: 15.7 s — ABNORMAL HIGH (ref 11.4–15.2)
Prothrombin Time: 15.4 s — ABNORMAL HIGH (ref 11.4–15.2)
Prothrombin Time: 15.2 s (ref 11.4–15.2)
Prothrombin Time: 15 s (ref 11.4–15.2)

## 2023-10-07 LAB — APTT
aPTT: 56 s — ABNORMAL HIGH (ref 24–36)
aPTT: 62 s — ABNORMAL HIGH (ref 24–36)
aPTT: 58 s — ABNORMAL HIGH (ref 24–36)
aPTT: 47 s — ABNORMAL HIGH (ref 24–36)

## 2023-10-07 LAB — MAGNESIUM
Magnesium: 2.2 mg/dL (ref 1.7–2.4)
Magnesium: 2.2 mg/dL (ref 1.7–2.4)
Magnesium: 2.1 mg/dL (ref 1.7–2.4)
Magnesium: 2.1 mg/dL (ref 1.7–2.4)

## 2023-10-07 LAB — ABO/RH: ABO/RH(D): O POS

## 2023-10-07 LAB — PHOSPHORUS
Phosphorus: 2.6 mg/dL (ref 2.5–4.6)
Phosphorus: 2.9 mg/dL (ref 2.5–4.6)
Phosphorus: 3 mg/dL (ref 2.5–4.6)
Phosphorus: 4.8 mg/dL — ABNORMAL HIGH (ref 2.5–4.6)

## 2023-10-07 MED ORDER — ALBUMIN HUMAN 5 % IV SOLN
250.0000 mL | Freq: Once | INTRAVENOUS | Status: AC
  Administered 2023-10-07: 12.5 g via INTRAVENOUS
  Filled 2023-10-07: qty 250

## 2023-10-07 MED ORDER — VASOPRESSIN 20 UNITS/100 ML INFUSION FOR ORGAN DONOR
0.0200 [IU]/h | INTRAVENOUS | Status: DC
Start: 2023-10-07 — End: 2023-10-10

## 2023-10-07 MED ORDER — VASOPRESSIN 20 UNITS/100 ML INFUSION FOR ORGAN DONOR
0.0100 [IU]/h | INTRAVENOUS | Status: DC
  Administered 2023-10-07: 0.1 [IU]/h via INTRAVENOUS
  Filled 2023-10-07: qty 100

## 2023-10-07 MED ORDER — POTASSIUM PHOSPHATES 15 MMOLE/5ML IV SOLN
15.0000 mmol | Freq: Once | INTRAVENOUS | Status: AC
  Administered 2023-10-07: 15 mmol via INTRAVENOUS
  Filled 2023-10-07: qty 5

## 2023-10-07 MED ORDER — LACTATED RINGERS IV BOLUS
500.0000 mL | Freq: Once | INTRAVENOUS | Status: AC
  Administered 2023-10-07: 500 mL via INTRAVENOUS

## 2023-10-07 MED ORDER — TECHNETIUM TC 99M EXAMETAZIME IV KIT
20.0000 | PACK | Freq: Once | INTRAVENOUS | Status: AC | PRN
  Administered 2023-10-07: 20 via INTRAVENOUS

## 2023-10-07 MED ORDER — SODIUM CHLORIDE 0.45 % IV BOLUS
500.0000 mL | Freq: Once | INTRAVENOUS | Status: AC
  Administered 2023-10-07: 500 mL via INTRAVENOUS

## 2023-10-07 MED ORDER — SODIUM CHLORIDE 0.9 % IV SOLN
1000.0000 mg | INTRAVENOUS | Status: AC
  Administered 2023-10-07: 1000 mg via INTRAVENOUS
  Filled 2023-10-07: qty 16

## 2023-10-07 MED ORDER — FREE WATER
300.0000 mL | Status: DC
  Administered 2023-10-07 – 2023-10-08 (×17): 300 mL

## 2023-10-07 NOTE — TOC CM/SW Note (Signed)
Transition of Care [facility]) - Inpatient Brief Assessment   Patient Details  Name: Ryan Silva MRN: [id number] Date of Birth: 08-06-1966  Transition of Care [facility]) CM/SW Contact:    Mearl Latin, LCSW Phone Number: 09/14/2023, 2:48 PM   Clinical Narrative: Patient admitted from home and is currently still intubated and undergoing brain death testing. No current TOC needs identified but please place consult if needed.    Transition of Care Asessment: Insurance and Status: Insurance coverage has been reviewed Patient has primary care physician: Yes Home environment has been reviewed: From home Prior level of function:: Independent Prior/Current Home Services: No current home services Social Determinants of Health Reivew: SDOH reviewed no interventions necessary Readmission risk has been reviewed: Yes Transition of care needs: no transition of care needs at this time

## 2023-10-07 NOTE — Plan of Care (Signed)
IR was requested for liver bx for organ donor.   57 yo male with ICH, he was pronounced dead by neurological criteria today.  Per ICU team, patient to receive FFP before liver bx, asked to perform bx around 4:30.   IR/US team notified.   The procedure is tentatively scheduled for this afternoon around 4:30.   Please call IR for questions and concerns.    Lynann Bologna Holten Spano PA-C 10/03/2023 3:10 PM

## 2023-10-07 NOTE — Progress Notes (Signed)
RT NOTE: Recruitment maneuver completed per Donor Services for one minute with following settings: PC above PEEP of 10, PEEP of 20, RR 10, and 100% FiO2. FiO2 left at 100% and PEEP dropped to 5. ABG to be obtained in 30 minutes and then PEEP returned to 10. Vitals are stable. RT will continue to monitor.

## 2023-10-07 NOTE — Procedures (Signed)
Adult Brain Death Determination  Time of Examination: 2023/11/03 11:43 AM  No Evidence of /Cause of Reversible CNS Depression  Core temperature must be greater >36 degrees. Last temp: Temp: (!) 97.2 F (36.2 C) (Note: If unable to achieve normothermia after 12 hours of temperature management, may consider proceeding with Brain Death Evaluation.):    yes  Evidence of severe metabolic perturbations that could potentate CNS depression. Consider glucose, Na, creatinine, PaCO2, SaO2.:    Absent  Evidence of drugs, by history or measurement, that could potentiate central nervous system depression: narcotics, ethanol, benzodiazepines, barbiturates, neuromuscular blockade.:     Absent  Absence of Cortical Function  GCS = 3:    yes  Absence of Brain Stem Reflexes and Responses  Pupils light-fixed    yes  Corneal reflexes:    Absent  Response to upper and lower airway stimulation, such as pharyngeal and endotracheal suctioning.:    Absent  Ocular response to head turning (eye movement).    Absent  Absence of Spontaneous Respirations  (Apnea test performed per Brain Death Policy. If not met due to hemodynamic/ventilatory instability, then perform EEG, TCD, or cerebral blow flow studies.)  1.Testing not performed - Cerebral blood flow testing performed.    Document Confirmatory Test Utilized: (Optional) Nuclear cerebral flow, cerebral angiography (CT/MR angio), transcranial Doppler ultrasound, EEG, SSEP (record results).   Cerebral blood flow testing performed.  IMPRESSION: Absence of cerebral blood flow by nuclear medicine brain perfusion imaging.     Electronically Signed   By: Genevive Bi M.D.   On: 11-03-23 10:49  Patient pronounced dead by neurological criteria at 10:49 AM on November 03, 2023.  Charlott Holler, MD 2023-11-03 11:43 AM

## 2023-10-07 NOTE — Progress Notes (Addendum)
NAME:  Ryan Silva, MRN:  [id number], DOB:  21-Jan-1966, LOS: 5 ADMISSION DATE:  10/02/2023, CONSULTATION DATE: 10/02/2023 REFERRING MD:  Rolla Flatten, MD , CHIEF COMPLAINT: Unresponsiveness  History of Present Illness:  57 year old male who was brought into the emergency department after he was found down by the bedside by his wife.  His last known well was 4 PM yesterday, upon arrival to ED patient was noted to be posturing, his GCS was 3 with systolic blood pressure ranging in 300s.  He was noted to have pinpoint pupils, he was given Narcan without much improvement, he was noted to vomiting from the mouth with a left gaze, he was immediately intubated.  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 CTA abdomen/pelvis/head and neck showed no aneurysm or aortic dissection. Patient was evaluated by neurosurgery, EVD was placed, evaluated by neurology, recommend admission to ICU  Pertinent  Medical History  Hypertension  Significant Hospital Events: Including procedures, antibiotic start and stop dates in addition to other pertinent events   11/22 EVD placed EEG 11/23 >> severe encephalopathy without any evidence of seizures or epileptiform discharges Echocardiogram 11/23 > apical cavity obliteration, LVEF 70-75% with moderate concentric LVH and grade 2 diastolic dysfunction.  RV size and function are normal  Interim History / Subjective:   No overnight issues continues to be non responsive.   Objective   Blood pressure (!) 100/59, pulse (!) 57, temperature 97.7 F (36.5 C), resp. rate (!) 22, height 5\' 10"  (1.778 m), weight 69.1 kg, SpO2 92%.    Vent Mode: PRVC FiO2 (%):  [40 %-100 %] 40 % Set Rate:  [22 bmp] 22 bmp Vt Set:  [440 mL] 440 mL PEEP:  [5 cmH20-10 cmH20] 10 cmH20 Plateau Pressure:  [12 cmH20-21  cmH20] 20 cmH20   Intake/Output Summary (Last 24 hours) at 09/28/2023 1052 Last data filed at 09/25/2023 1000 Gross per 24 hour  Intake 3376.29 ml  Output 3460 ml  Net -83.71 ml   Filed Weights   10/05/23 0500 10/06/23 0700 10/05/2023 0444  Weight: 69.4 kg 69.4 kg 69.1 kg    Examination: Gen:      Intubated, sedated, acutely ill appearing HEENT:  ETT to vent, EVD in place, no cough, gag Lungs:    sounds of mechanical ventilation auscultated no wheeze CV:         RRR Abd:      + bowel sounds; soft, non-tender; no palpable masses, no distension Ext:    No edema Skin:      Warm and dry; no rashes Neuro:   off sedation, not responsive, GCS3 Absent cough and gag. No corneal reflex. Pupils are fixed and dilated with no response to light. Absent oculocephalic reflex. 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  - honor bridge managing electrolytes, medications etc - maintain full vent support and lung protective ventilation - ccm available  to assist as needed.   The patient is critically ill due to concern for brain death.  Critical care was necessary to treat or prevent imminent or life-threatening deterioration.  Critical care was time spent personally by me on the following activities: development of treatment plan with patient and/or surrogate as well as nursing, discussions with consultants,  evaluation of patient's response to treatment, examination of patient, obtaining history from patient or surrogate, ordering and performing treatments and interventions, ordering and review of laboratory studies, ordering and review of radiographic studies, pulse oximetry, re-evaluation of patient's condition and participation in multidisciplinary rounds.   Critical Care Time devoted to patient care services described in this note is 31  minutes. This time reflects time of care of this signee Charlott Holler . This critical care time does not reflect separately billable procedures or procedure time, teaching time or supervisory time of PA/NP/Med student/Med Resident etc but could involve care discussion time.       Charlott Holler [facility] 09/27/2023 10:53 AM  Pager: see AMION  If no response to pager , please call critical care on call (see AMION) until 7pm After 7:00 pm call Elink

## 2023-10-07 NOTE — Progress Notes (Signed)
RT NOTE: RT transported patient on ventilator from room 4N25 to nuclear medicine, then from nuclear medicine to CT, and then back to room 4N25 with no complications. Vitals are stable. RT will continue to monitor.

## 2023-10-07 NOTE — Procedures (Signed)
Interventional Radiology Procedure Note  Procedure:  Image guided bedside medical liver biopsy, for purposes of organ donation. The transplant staff was present to provide documentation and take possession of the sample for transport.   Complications: None  Recommendations:  - Continue care    Signed,  Yvone Neu. Loreta Ave, DO

## 2023-10-08 DIAGNOSIS — I161 Hypertensive emergency: Secondary | ICD-10-CM | POA: Diagnosis not present

## 2023-10-08 DIAGNOSIS — I615 Nontraumatic intracerebral hemorrhage, intraventricular: Secondary | ICD-10-CM | POA: Diagnosis not present

## 2023-10-08 DIAGNOSIS — J9601 Acute respiratory failure with hypoxia: Secondary | ICD-10-CM | POA: Diagnosis not present

## 2023-10-08 DIAGNOSIS — G936 Cerebral edema: Secondary | ICD-10-CM | POA: Diagnosis not present

## 2023-10-08 LAB — POCT I-STAT 7, (LYTES, BLD GAS, ICA,H+H)
Acid-base deficit: 3 mmol/L — ABNORMAL HIGH (ref 0.0–2.0)
Acid-base deficit: 3 mmol/L — ABNORMAL HIGH (ref 0.0–2.0)
Acid-base deficit: 3 mmol/L — ABNORMAL HIGH (ref 0.0–2.0)
Acid-base deficit: 3 mmol/L — ABNORMAL HIGH (ref 0.0–2.0)
Acid-base deficit: 3 mmol/L — ABNORMAL HIGH (ref 0.0–2.0)
Acid-base deficit: 4 mmol/L — ABNORMAL HIGH (ref 0.0–2.0)
Bicarbonate: 21.8 mmol/L (ref 20.0–28.0)
Bicarbonate: 22 mmol/L (ref 20.0–28.0)
Bicarbonate: 22.1 mmol/L (ref 20.0–28.0)
Bicarbonate: 22.1 mmol/L (ref 20.0–28.0)
Bicarbonate: 22.6 mmol/L (ref 20.0–28.0)
Bicarbonate: 22.6 mmol/L (ref 20.0–28.0)
Calcium, Ion: 1.16 mmol/L (ref 1.15–1.40)
Calcium, Ion: 1.16 mmol/L (ref 1.15–1.40)
Calcium, Ion: 1.19 mmol/L (ref 1.15–1.40)
Calcium, Ion: 1.19 mmol/L (ref 1.15–1.40)
Calcium, Ion: 1.21 mmol/L (ref 1.15–1.40)
Calcium, Ion: 1.23 mmol/L (ref 1.15–1.40)
HCT: 28 % — ABNORMAL LOW (ref 39.0–52.0)
HCT: 28 % — ABNORMAL LOW (ref 39.0–52.0)
HCT: 29 % — ABNORMAL LOW (ref 39.0–52.0)
HCT: 29 % — ABNORMAL LOW (ref 39.0–52.0)
HCT: 29 % — ABNORMAL LOW (ref 39.0–52.0)
HCT: 30 % — ABNORMAL LOW (ref 39.0–52.0)
Hemoglobin: 10.2 g/dL — ABNORMAL LOW (ref 13.0–17.0)
Hemoglobin: 9.5 g/dL — ABNORMAL LOW (ref 13.0–17.0)
Hemoglobin: 9.5 g/dL — ABNORMAL LOW (ref 13.0–17.0)
Hemoglobin: 9.9 g/dL — ABNORMAL LOW (ref 13.0–17.0)
Hemoglobin: 9.9 g/dL — ABNORMAL LOW (ref 13.0–17.0)
Hemoglobin: 9.9 g/dL — ABNORMAL LOW (ref 13.0–17.0)
O2 Saturation: 100 %
O2 Saturation: 100 %
O2 Saturation: 100 %
O2 Saturation: 100 %
O2 Saturation: 100 %
O2 Saturation: 100 %
Patient temperature: 35.8
Patient temperature: 36.6
Patient temperature: 37
Patient temperature: 37
Patient temperature: 37.2
Patient temperature: 37.7
Potassium: 2.3 mmol/L — CL (ref 3.5–5.1)
Potassium: 2.3 mmol/L — CL (ref 3.5–5.1)
Potassium: 2.3 mmol/L — CL (ref 3.5–5.1)
Potassium: 2.9 mmol/L — ABNORMAL LOW (ref 3.5–5.1)
Potassium: 3.6 mmol/L (ref 3.5–5.1)
Potassium: 3.8 mmol/L (ref 3.5–5.1)
Sodium: 152 mmol/L — ABNORMAL HIGH (ref 135–145)
Sodium: 155 mmol/L — ABNORMAL HIGH (ref 135–145)
Sodium: 158 mmol/L — ABNORMAL HIGH (ref 135–145)
Sodium: 159 mmol/L — ABNORMAL HIGH (ref 135–145)
Sodium: 159 mmol/L — ABNORMAL HIGH (ref 135–145)
Sodium: 162 mmol/L (ref 135–145)
TCO2: 23 mmol/L (ref 22–32)
TCO2: 23 mmol/L (ref 22–32)
TCO2: 23 mmol/L (ref 22–32)
TCO2: 23 mmol/L (ref 22–32)
TCO2: 24 mmol/L (ref 22–32)
TCO2: 24 mmol/L (ref 22–32)
pCO2 arterial: 35.4 mm[Hg] (ref 32–48)
pCO2 arterial: 39.3 mm[Hg] (ref 32–48)
pCO2 arterial: 39.7 mm[Hg] (ref 32–48)
pCO2 arterial: 40.2 mm[Hg] (ref 32–48)
pCO2 arterial: 41.8 mm[Hg] (ref 32–48)
pCO2 arterial: 42.6 mm[Hg] (ref 32–48)
pH, Arterial: 7.331 — ABNORMAL LOW (ref 7.35–7.45)
pH, Arterial: 7.341 — ABNORMAL LOW (ref 7.35–7.45)
pH, Arterial: 7.351 (ref 7.35–7.45)
pH, Arterial: 7.351 (ref 7.35–7.45)
pH, Arterial: 7.353 (ref 7.35–7.45)
pH, Arterial: 7.395 (ref 7.35–7.45)
pO2, Arterial: 317 mm[Hg] — ABNORMAL HIGH (ref 83–108)
pO2, Arterial: 339 mm[Hg] — ABNORMAL HIGH (ref 83–108)
pO2, Arterial: 394 mm[Hg] — ABNORMAL HIGH (ref 83–108)
pO2, Arterial: 405 mm[Hg] — ABNORMAL HIGH (ref 83–108)
pO2, Arterial: 412 mm[Hg] — ABNORMAL HIGH (ref 83–108)
pO2, Arterial: 442 mm[Hg] — ABNORMAL HIGH (ref 83–108)

## 2023-10-08 LAB — BASIC METABOLIC PANEL
Anion gap: 8 (ref 5–15)
Anion gap: 6 (ref 5–15)
Anion gap: 10 (ref 5–15)
Anion gap: 7 (ref 5–15)
BUN: 28 mg/dL — ABNORMAL HIGH (ref 6–20)
BUN: 31 mg/dL — ABNORMAL HIGH (ref 6–20)
BUN: 32 mg/dL — ABNORMAL HIGH (ref 6–20)
BUN: 32 mg/dL — ABNORMAL HIGH (ref 6–20)
CO2: 20 mmol/L — ABNORMAL LOW (ref 22–32)
CO2: 22 mmol/L (ref 22–32)
CO2: 20 mmol/L — ABNORMAL LOW (ref 22–32)
CO2: 20 mmol/L — ABNORMAL LOW (ref 22–32)
Calcium: 8.3 mg/dL — ABNORMAL LOW (ref 8.9–10.3)
Calcium: 8 mg/dL — ABNORMAL LOW (ref 8.9–10.3)
Calcium: 8 mg/dL — ABNORMAL LOW (ref 8.9–10.3)
Calcium: 7.8 mg/dL — ABNORMAL LOW (ref 8.9–10.3)
Chloride: 129 mmol/L — ABNORMAL HIGH (ref 98–111)
Chloride: 125 mmol/L — ABNORMAL HIGH (ref 98–111)
Chloride: 123 mmol/L — ABNORMAL HIGH (ref 98–111)
Chloride: 122 mmol/L — ABNORMAL HIGH (ref 98–111)
Creatinine, Ser: 1.95 mg/dL — ABNORMAL HIGH (ref 0.61–1.24)
Creatinine, Ser: 1.86 mg/dL — ABNORMAL HIGH (ref 0.61–1.24)
Creatinine, Ser: 1.99 mg/dL — ABNORMAL HIGH (ref 0.61–1.24)
Creatinine, Ser: 1.86 mg/dL — ABNORMAL HIGH (ref 0.61–1.24)
GFR, Estimated: 40 mL/min — ABNORMAL LOW (ref >60)
GFR, Estimated: 42 mL/min — ABNORMAL LOW (ref >60)
GFR, Estimated: 39 mL/min — ABNORMAL LOW (ref >60)
GFR, Estimated: 42 mL/min — ABNORMAL LOW (ref >60)
Glucose, Bld: 299 mg/dL — ABNORMAL HIGH (ref 70–99)
Glucose, Bld: 342 mg/dL — ABNORMAL HIGH (ref 70–99)
Glucose, Bld: 216 mg/dL — ABNORMAL HIGH (ref 70–99)
Glucose, Bld: 246 mg/dL — ABNORMAL HIGH (ref 70–99)
Potassium: 2.3 mmol/L — CL (ref 3.5–5.1)
Potassium: 2.4 mmol/L — CL (ref 3.5–5.1)
Potassium: 3.4 mmol/L — ABNORMAL LOW (ref 3.5–5.1)
Potassium: 3.8 mmol/L (ref 3.5–5.1)
Sodium: 157 mmol/L — ABNORMAL HIGH (ref 135–145)
Sodium: 153 mmol/L — ABNORMAL HIGH (ref 135–145)
Sodium: 153 mmol/L — ABNORMAL HIGH (ref 135–145)
Sodium: 149 mmol/L — ABNORMAL HIGH (ref 135–145)

## 2023-10-08 LAB — GLUCOSE, CAPILLARY
Glucose-Capillary: 180 mg/dL — ABNORMAL HIGH (ref 70–99)
Glucose-Capillary: 204 mg/dL — ABNORMAL HIGH (ref 70–99)
Glucose-Capillary: 232 mg/dL — ABNORMAL HIGH (ref 70–99)
Glucose-Capillary: 267 mg/dL — ABNORMAL HIGH (ref 70–99)
Glucose-Capillary: 351 mg/dL — ABNORMAL HIGH (ref 70–99)

## 2023-10-08 LAB — CBC
HCT: 34.3 % — ABNORMAL LOW (ref 39.0–52.0)
HCT: 32.8 % — ABNORMAL LOW (ref 39.0–52.0)
HCT: 32.4 % — ABNORMAL LOW (ref 39.0–52.0)
Hemoglobin: 10.9 g/dL — ABNORMAL LOW (ref 13.0–17.0)
Hemoglobin: 10.5 g/dL — ABNORMAL LOW (ref 13.0–17.0)
Hemoglobin: 10.4 g/dL — ABNORMAL LOW (ref 13.0–17.0)
MCH: 31.3 pg (ref 26.0–34.0)
MCH: 31.3 pg (ref 26.0–34.0)
MCH: 31 pg (ref 26.0–34.0)
MCHC: 31.8 g/dL (ref 30.0–36.0)
MCHC: 32 g/dL (ref 30.0–36.0)
MCHC: 32.1 g/dL (ref 30.0–36.0)
MCV: 98.6 fL (ref 80.0–100.0)
MCV: 97.9 fL (ref 80.0–100.0)
MCV: 96.4 fL (ref 80.0–100.0)
Platelets: 75 10*3/uL — ABNORMAL LOW (ref 150–400)
Platelets: 95 10*3/uL — ABNORMAL LOW (ref 150–400)
Platelets: 92 10*3/uL — ABNORMAL LOW (ref 150–400)
RBC: 3.48 MIL/uL — ABNORMAL LOW (ref 4.22–5.81)
RBC: 3.35 MIL/uL — ABNORMAL LOW (ref 4.22–5.81)
RBC: 3.36 MIL/uL — ABNORMAL LOW (ref 4.22–5.81)
RDW: 14.8 % (ref 11.5–15.5)
RDW: 14.6 % (ref 11.5–15.5)
RDW: 14.5 % (ref 11.5–15.5)
WBC: 12.3 10*3/uL — ABNORMAL HIGH (ref 4.0–10.5)
WBC: 13.1 10*3/uL — ABNORMAL HIGH (ref 4.0–10.5)
WBC: 15.2 10*3/uL — ABNORMAL HIGH (ref 4.0–10.5)
nRBC: 0 % (ref 0.0–0.2)
nRBC: 0 % (ref 0.0–0.2)
nRBC: 0.2 % (ref 0.0–0.2)

## 2023-10-08 LAB — HEPATIC FUNCTION PANEL
ALT: 25 U/L (ref 0–44)
ALT: 22 U/L (ref 0–44)
ALT: 21 U/L (ref 0–44)
ALT: 21 U/L (ref 0–44)
AST: 37 U/L (ref 15–41)
AST: 32 U/L (ref 15–41)
AST: 28 U/L (ref 15–41)
AST: 26 U/L (ref 15–41)
Albumin: 2.4 g/dL — ABNORMAL LOW (ref 3.5–5.0)
Albumin: 2.4 g/dL — ABNORMAL LOW (ref 3.5–5.0)
Albumin: 2.3 g/dL — ABNORMAL LOW (ref 3.5–5.0)
Albumin: 2.3 g/dL — ABNORMAL LOW (ref 3.5–5.0)
Alkaline Phosphatase: 48 U/L (ref 38–126)
Alkaline Phosphatase: 46 U/L (ref 38–126)
Alkaline Phosphatase: 48 U/L (ref 38–126)
Alkaline Phosphatase: 49 U/L (ref 38–126)
Bilirubin, Direct: 0.6 mg/dL — ABNORMAL HIGH (ref 0.0–0.2)
Bilirubin, Direct: 0.4 mg/dL — ABNORMAL HIGH (ref 0.0–0.2)
Bilirubin, Direct: 0.4 mg/dL — ABNORMAL HIGH (ref 0.0–0.2)
Bilirubin, Direct: 0.3 mg/dL — ABNORMAL HIGH (ref 0.0–0.2)
Indirect Bilirubin: 0.8 mg/dL (ref 0.3–0.9)
Indirect Bilirubin: 0.4 mg/dL (ref 0.3–0.9)
Indirect Bilirubin: 0.5 mg/dL (ref 0.3–0.9)
Indirect Bilirubin: 0.5 mg/dL (ref 0.3–0.9)
Total Bilirubin: 1.4 mg/dL — ABNORMAL HIGH (ref <1.2)
Total Bilirubin: 0.8 mg/dL (ref <1.2)
Total Bilirubin: 0.9 mg/dL (ref <1.2)
Total Bilirubin: 0.8 mg/dL (ref <1.2)
Total Protein: 6 g/dL — ABNORMAL LOW (ref 6.5–8.1)
Total Protein: 6 g/dL — ABNORMAL LOW (ref 6.5–8.1)
Total Protein: 5.9 g/dL — ABNORMAL LOW (ref 6.5–8.1)
Total Protein: 6 g/dL — ABNORMAL LOW (ref 6.5–8.1)

## 2023-10-08 LAB — MAGNESIUM
Magnesium: 2.1 mg/dL (ref 1.7–2.4)
Magnesium: 2.1 mg/dL (ref 1.7–2.4)
Magnesium: 2.2 mg/dL (ref 1.7–2.4)
Magnesium: 2.2 mg/dL (ref 1.7–2.4)

## 2023-10-08 LAB — URINALYSIS, ROUTINE W REFLEX MICROSCOPIC
Bilirubin Urine: NEGATIVE
Glucose, UA: 150 mg/dL — AB
Ketones, ur: NEGATIVE mg/dL
Leukocytes,Ua: NEGATIVE
Nitrite: NEGATIVE
Protein, ur: 30 mg/dL — AB
Specific Gravity, Urine: 1.019 (ref 1.005–1.030)
pH: 5 (ref 5.0–8.0)

## 2023-10-08 LAB — PROTIME-INR
INR: 1.2 (ref 0.8–1.2)
INR: 1.2 (ref 0.8–1.2)
INR: 1.1 (ref 0.8–1.2)
Prothrombin Time: 15.1 s (ref 11.4–15.2)
Prothrombin Time: 15.3 s — ABNORMAL HIGH (ref 11.4–15.2)
Prothrombin Time: 14.8 s (ref 11.4–15.2)

## 2023-10-08 LAB — CULTURE, BAL-QUANTITATIVE W GRAM STAIN: Special Requests: NORMAL

## 2023-10-08 LAB — PREPARE FRESH FROZEN PLASMA: Unit division: 0

## 2023-10-08 LAB — PHOSPHORUS
Phosphorus: 4.1 mg/dL (ref 2.5–4.6)
Phosphorus: 5 mg/dL — ABNORMAL HIGH (ref 2.5–4.6)
Phosphorus: 4.9 mg/dL — ABNORMAL HIGH (ref 2.5–4.6)
Phosphorus: 4.5 mg/dL (ref 2.5–4.6)

## 2023-10-08 LAB — BPAM FFP
Blood Product Expiration Date: 202412012359
ISSUE DATE / TIME: 202411271551
Unit Type and Rh: 6200

## 2023-10-08 LAB — APTT
aPTT: 41 s — ABNORMAL HIGH (ref 24–36)
aPTT: 37 s — ABNORMAL HIGH (ref 24–36)
aPTT: 35 s (ref 24–36)
aPTT: 34 s (ref 24–36)

## 2023-10-08 MED ORDER — DEXTROSE 5 % IV SOLN: INTRAVENOUS | Status: DC

## 2023-10-08 MED ORDER — POTASSIUM PHOSPHATES 15 MMOLE/5ML IV SOLN
30.0000 mmol | Freq: Once | INTRAVENOUS | Status: AC
  Administered 2023-10-08: 30 mmol via INTRAVENOUS
  Filled 2023-10-08: qty 10

## 2023-10-08 MED ORDER — POTASSIUM CHLORIDE 20 MEQ PO PACK
40.0000 meq | PACK | ORAL | Status: AC
Start: 2023-10-08 — End: 2023-10-08
  Administered 2023-10-08 (×2): 40 meq
  Filled 2023-10-08 (×2): qty 2

## 2023-10-08 MED ORDER — INSULIN ASPART 100 UNIT/ML IJ SOLN: 0.0000 [IU] | INTRAMUSCULAR | Status: DC

## 2023-10-08 MED ORDER — SODIUM CHLORIDE 0.9% IV SOLUTION: Freq: Once | INTRAVENOUS | Status: DC

## 2023-10-08 MED ORDER — INSULIN ASPART 100 UNIT/ML IJ SOLN
0.0000 [IU] | INTRAMUSCULAR | Status: DC
  Administered 2023-10-08: 3 [IU] via SUBCUTANEOUS
  Administered 2023-10-08: 15 [IU] via SUBCUTANEOUS
  Administered 2023-10-08: 5 [IU] via SUBCUTANEOUS
  Administered 2023-10-08: 8 [IU] via SUBCUTANEOUS
  Administered 2023-10-09: 5 [IU] via SUBCUTANEOUS
  Administered 2023-10-09: 3 [IU] via SUBCUTANEOUS
  Administered 2023-10-09 (×2): 5 [IU] via SUBCUTANEOUS

## 2023-10-08 NOTE — Progress Notes (Signed)
NAME:  Ryan Silva, MRN:  [id number], DOB:  01-12-1966, LOS: 6 ADMISSION DATE:  10/02/2023, CONSULTATION DATE: 10/02/2023 REFERRING MD:  Rolla Flatten, MD , CHIEF COMPLAINT: Unresponsiveness  History of Present Illness:  57 year old male who was brought into the emergency department after he was found down by the bedside by his wife.  His last known well was 4 PM yesterday, upon arrival to ED patient was noted to be posturing, his GCS was 3 with systolic blood pressure ranging in 300s.  He was noted to have pinpoint pupils, he was given Narcan without much improvement, he was noted to vomiting from the mouth with a left gaze, he was immediately intubated.  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 CTA abdomen/pelvis/head and neck showed no aneurysm or aortic dissection. Patient was evaluated by neurosurgery, EVD was placed, evaluated by neurology, recommend admission to ICU  Pertinent  Medical History  Hypertension  Significant Hospital Events: Including procedures, antibiotic start and stop dates in addition to other pertinent events   11/22 EVD placed EEG 11/23 >> severe encephalopathy without any evidence of seizures or epileptiform discharges Echocardiogram 11/23 > apical cavity obliteration, LVEF 70-75% with moderate concentric LVH and grade 2 diastolic dysfunction.  RV size and function are normal 2023/10/13 Declared brain dead supported by NM brain perfusion scan  Interim History / Subjective:   Declared brain dead supported by NM brain perfusion scan October 13, 2023  Objective   Blood pressure 118/67, pulse (!) 59, temperature (!) 97 F (36.1 C), resp. rate (!) 22, height 5\' 10"  (1.778 m), weight 69.1 kg, SpO2 96%.    Vent Mode: PRVC FiO2 (%):  [40 %-100 %] 40 % Set Rate:  [22 bmp] 22 bmp Vt Set:  [440 mL-500 mL]  500 mL PEEP:  [5 cmH20-10 cmH20] 10 cmH20 Plateau Pressure:  [21 cmH20-22 cmH20] 21 cmH20   Intake/Output Summary (Last 24 hours) at 10/08/2023 1058 Last data filed at 10/08/2023 1052 Gross per 24 hour  Intake 7205.18 ml  Output 3925 ml  Net 3280.18 ml   Filed Weights   10/05/23 0500 10/06/23 0700 2023-10-13 0444  Weight: 69.4 kg 69.4 kg 69.1 kg    Examination: Gen:      Intubated, sedated, acutely ill appearing HEENT:  ETT to vent no cough, gag Lungs:    sounds of mechanical ventilation auscultated no wheeze CV:         RRR Abd:      + bowel sounds; soft, non-tender; no palpable masses, no distension Ext:    No edema Skin:      Warm and dry; no rashes Neuro:   off sedation, not responsive, GCS3 Absent cough and gag. No corneal reflex. Pupils are fixed and dilated with no response to light.. 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   CRITICAL CARE Performed by: Lesia Sago Darnise Montag   Total critical care time: 31 minutes  Critical care time was exclusive of separately billable procedures and treating other patients.  Critical care was necessary to treat or prevent imminent or life-threatening deterioration.  Critical care was time spent personally by me on the following activities: development of  treatment plan with patient and/or surrogate as well as nursing, discussions with consultants, evaluation of patient's response to treatment, examination of patient, obtaining history from patient or surrogate, ordering and performing treatments and interventions, ordering and review of laboratory studies, ordering and review of radiographic studies, pulse oximetry and re-evaluation of patient's condition.      Lesia Sago Kyndal [facility] 10/08/2023 10:58 AM   see AMION If no response to pager , please call critical care on call (see AMION) until 7pm After 7:00 pm call Elink

## 2023-10-08 NOTE — Progress Notes (Signed)
   Date and time results received: 10/08/2023 0437     Test: Sodium  Critical Value: 162     Test: Potassium  Critical Value: 2.3   Name of Provider Notified: [address]      Signed,  Charmian Muff, RN

## 2023-10-08 NOTE — Progress Notes (Signed)
FiO2 increased to 100% and PEEP increased to +20 for then dropped to +5 30 min prior to ABG. @2020  Vt increased to 500 per Donor Services then ABG was drawn as charted. Post ABG FiO2 returned to 40% and PEEP to +10.

## 2023-10-08 NOTE — Progress Notes (Addendum)
Date and time results received: 10/06/2023 2357   Test: Sodium  Critical Value: 165   Test: Chloride  Critical Value: 130  Name of Provider Notified: [address]    Margret Chance, RN  11:57 PM 09/25/2023

## 2023-10-08 NOTE — Progress Notes (Signed)
RT NOTE: RT performed recruitment maneuver per [facility] with following settings for 1 minute: PC mode, PC above PEEP of 10, RR of 10, PEEP of 20 and FiO2 of 100%. Patient tolerated well. Vent settings then returned to previous settings with PEEP of 5 and 100% for FiO2 challenge. ABG to be obtained in 30 minutes. Vitals are stable. RT will continue to monitor.

## 2023-10-08 NOTE — Progress Notes (Signed)
FiO2 increased to 100% and PEEP increased to +20 for then dropped to +5 30 min prior to ABG. Post ABG FiO2 returned to 40% and PEEP to +10.

## 2023-10-08 NOTE — Progress Notes (Signed)
PEEP increased to 20 for 1 minute. No complications. PEEP decreased to 5 and FIO2 increased to 100% for 30 minutes per [employer]. RT will draw an ABG after the 30 minutes of +5/100%.

## 2023-10-09 ENCOUNTER — Encounter (HOSPITAL_COMMUNITY): Payer: Self-pay | Admitting: Internal Medicine

## 2023-10-09 ENCOUNTER — Inpatient Hospital Stay (HOSPITAL_COMMUNITY): Payer: Commercial Managed Care - HMO | Admitting: Anesthesiology

## 2023-10-09 ENCOUNTER — Encounter (HOSPITAL_COMMUNITY): Admission: EM | Disposition: E | Payer: Self-pay | Source: Home / Self Care | Attending: Internal Medicine

## 2023-10-09 ENCOUNTER — Inpatient Hospital Stay (HOSPITAL_COMMUNITY): Payer: Commercial Managed Care - HMO

## 2023-10-09 ENCOUNTER — Other Ambulatory Visit: Payer: Self-pay

## 2023-10-09 DIAGNOSIS — G9382 Brain death: Secondary | ICD-10-CM | POA: Diagnosis not present

## 2023-10-09 DIAGNOSIS — I615 Nontraumatic intracerebral hemorrhage, intraventricular: Secondary | ICD-10-CM | POA: Diagnosis not present

## 2023-10-09 HISTORY — PX: ORGAN PROCUREMENT: SHX5270

## 2023-10-09 LAB — APTT
aPTT: 33 s (ref 24–36)
aPTT: 33 s (ref 24–36)

## 2023-10-09 LAB — POCT I-STAT 7, (LYTES, BLD GAS, ICA,H+H)
Acid-base deficit: 3 mmol/L — ABNORMAL HIGH (ref 0.0–2.0)
Acid-base deficit: 2 mmol/L (ref 0.0–2.0)
Acid-base deficit: 2 mmol/L (ref 0.0–2.0)
Bicarbonate: 21.8 mmol/L (ref 20.0–28.0)
Bicarbonate: 22.8 mmol/L (ref 20.0–28.0)
Bicarbonate: 22.6 mmol/L (ref 20.0–28.0)
Calcium, Ion: 1.18 mmol/L (ref 1.15–1.40)
Calcium, Ion: 1.19 mmol/L (ref 1.15–1.40)
Calcium, Ion: 1.2 mmol/L (ref 1.15–1.40)
HCT: 29 % — ABNORMAL LOW (ref 39.0–52.0)
HCT: 30 % — ABNORMAL LOW (ref 39.0–52.0)
HCT: 30 % — ABNORMAL LOW (ref 39.0–52.0)
Hemoglobin: 9.9 g/dL — ABNORMAL LOW (ref 13.0–17.0)
Hemoglobin: 10.2 g/dL — ABNORMAL LOW (ref 13.0–17.0)
Hemoglobin: 10.2 g/dL — ABNORMAL LOW (ref 13.0–17.0)
O2 Saturation: 100 %
O2 Saturation: 100 %
O2 Saturation: 100 %
Patient temperature: 36.3
Patient temperature: 36.4
Patient temperature: 36.3
Potassium: 3.8 mmol/L (ref 3.5–5.1)
Potassium: 3.5 mmol/L (ref 3.5–5.1)
Potassium: 3.6 mmol/L (ref 3.5–5.1)
Sodium: 155 mmol/L — ABNORMAL HIGH (ref 135–145)
Sodium: 157 mmol/L — ABNORMAL HIGH (ref 135–145)
Sodium: 158 mmol/L — ABNORMAL HIGH (ref 135–145)
TCO2: 23 mmol/L (ref 22–32)
TCO2: 24 mmol/L (ref 22–32)
TCO2: 24 mmol/L (ref 22–32)
pCO2 arterial: 35.5 mm[Hg] (ref 32–48)
pCO2 arterial: 35.9 mm[Hg] (ref 32–48)
pCO2 arterial: 35.9 mm[Hg] (ref 32–48)
pH, Arterial: 7.394 (ref 7.35–7.45)
pH, Arterial: 7.409 (ref 7.35–7.45)
pH, Arterial: 7.404 (ref 7.35–7.45)
pO2, Arterial: 349 mm[Hg] — ABNORMAL HIGH (ref 83–108)
pO2, Arterial: 430 mm[Hg] — ABNORMAL HIGH (ref 83–108)
pO2, Arterial: 419 mm[Hg] — ABNORMAL HIGH (ref 83–108)

## 2023-10-09 LAB — HEPATIC FUNCTION PANEL
ALT: 21 U/L (ref 0–44)
ALT: 19 U/L (ref 0–44)
AST: 25 U/L (ref 15–41)
AST: 24 U/L (ref 15–41)
Albumin: 2.5 g/dL — ABNORMAL LOW (ref 3.5–5.0)
Albumin: 2.4 g/dL — ABNORMAL LOW (ref 3.5–5.0)
Alkaline Phosphatase: 48 U/L (ref 38–126)
Alkaline Phosphatase: 46 U/L (ref 38–126)
Bilirubin, Direct: 0.4 mg/dL — ABNORMAL HIGH (ref 0.0–0.2)
Bilirubin, Direct: 0.3 mg/dL — ABNORMAL HIGH (ref 0.0–0.2)
Indirect Bilirubin: 0.4 mg/dL (ref 0.3–0.9)
Indirect Bilirubin: 0.5 mg/dL (ref 0.3–0.9)
Total Bilirubin: 0.8 mg/dL (ref <1.2)
Total Bilirubin: 0.8 mg/dL (ref <1.2)
Total Protein: 6.1 g/dL — ABNORMAL LOW (ref 6.5–8.1)
Total Protein: 6 g/dL — ABNORMAL LOW (ref 6.5–8.1)

## 2023-10-09 LAB — URINALYSIS, ROUTINE W REFLEX MICROSCOPIC
Bilirubin Urine: NEGATIVE
Glucose, UA: NEGATIVE mg/dL
Ketones, ur: NEGATIVE mg/dL
Leukocytes,Ua: NEGATIVE
Nitrite: NEGATIVE
Protein, ur: NEGATIVE mg/dL
Specific Gravity, Urine: 1.004 — ABNORMAL LOW (ref 1.005–1.030)
pH: 5 (ref 5.0–8.0)

## 2023-10-09 LAB — BASIC METABOLIC PANEL
Anion gap: 7 (ref 5–15)
Anion gap: 8 (ref 5–15)
BUN: 32 mg/dL — ABNORMAL HIGH (ref 6–20)
BUN: 35 mg/dL — ABNORMAL HIGH (ref 6–20)
CO2: 21 mmol/L — ABNORMAL LOW (ref 22–32)
CO2: 21 mmol/L — ABNORMAL LOW (ref 22–32)
Calcium: 8.2 mg/dL — ABNORMAL LOW (ref 8.9–10.3)
Calcium: 8.3 mg/dL — ABNORMAL LOW (ref 8.9–10.3)
Chloride: 126 mmol/L — ABNORMAL HIGH (ref 98–111)
Chloride: 125 mmol/L — ABNORMAL HIGH (ref 98–111)
Creatinine, Ser: 1.98 mg/dL — ABNORMAL HIGH (ref 0.61–1.24)
Creatinine, Ser: 2.05 mg/dL — ABNORMAL HIGH (ref 0.61–1.24)
GFR, Estimated: 39 mL/min — ABNORMAL LOW (ref >60)
GFR, Estimated: 37 mL/min — ABNORMAL LOW (ref >60)
Glucose, Bld: 238 mg/dL — ABNORMAL HIGH (ref 70–99)
Glucose, Bld: 263 mg/dL — ABNORMAL HIGH (ref 70–99)
Potassium: 3.9 mmol/L (ref 3.5–5.1)
Potassium: 3.5 mmol/L (ref 3.5–5.1)
Sodium: 154 mmol/L — ABNORMAL HIGH (ref 135–145)
Sodium: 154 mmol/L — ABNORMAL HIGH (ref 135–145)

## 2023-10-09 LAB — PHOSPHORUS
Phosphorus: 4.3 mg/dL (ref 2.5–4.6)
Phosphorus: 3.8 mg/dL (ref 2.5–4.6)

## 2023-10-09 LAB — BPAM PLATELET PHERESIS
Blood Product Expiration Date: 202411292359
ISSUE DATE / TIME: 202411280815
Unit Type and Rh: 5100

## 2023-10-09 LAB — PREPARE PLATELET PHERESIS: Unit division: 0

## 2023-10-09 LAB — TYPE AND SCREEN
ABO/RH(D): O POS
Antibody Screen: NEGATIVE

## 2023-10-09 LAB — GLUCOSE, CAPILLARY
Glucose-Capillary: 202 mg/dL — ABNORMAL HIGH (ref 70–99)
Glucose-Capillary: 198 mg/dL — ABNORMAL HIGH (ref 70–99)
Glucose-Capillary: 217 mg/dL — ABNORMAL HIGH (ref 70–99)

## 2023-10-09 LAB — MAGNESIUM
Magnesium: 2.2 mg/dL (ref 1.7–2.4)
Magnesium: 2.4 mg/dL (ref 1.7–2.4)

## 2023-10-09 LAB — URINE CULTURE
Culture: NO GROWTH
Special Requests: NORMAL

## 2023-10-09 LAB — SARS CORONAVIRUS 2 BY RT PCR: SARS Coronavirus 2 by RT PCR: NEGATIVE

## 2023-10-09 SURGERY — SURGICAL PROCUREMENT, ORGAN
Anesthesia: General

## 2023-10-09 MED ORDER — FREE WATER
300.0000 mL | Freq: Four times a day (QID) | Status: DC
  Administered 2023-10-09: 300 mL

## 2023-10-09 MED ORDER — POTASSIUM CHLORIDE 20 MEQ PO PACK
40.0000 meq | PACK | Freq: Once | ORAL | Status: AC
  Administered 2023-10-09: 40 meq
  Filled 2023-10-09: qty 2

## 2023-10-09 MED ORDER — VANCOMYCIN HCL IN DEXTROSE 1-5 GM/200ML-% IV SOLN
1000.0000 mg | INTRAVENOUS | Status: AC
  Administered 2023-10-09: 1000 mg via INTRAVENOUS
  Filled 2023-10-09: qty 200

## 2023-10-09 MED ORDER — HEPARIN SODIUM (PORCINE) 1000 UNIT/ML IJ SOLN
INTRAMUSCULAR | Status: DC | PRN
  Administered 2023-10-09: 30000 [IU] via INTRAVENOUS

## 2023-10-09 MED ORDER — HEPARIN SODIUM (PORCINE) 1000 UNIT/ML IJ SOLN
INTRAMUSCULAR | Status: AC
  Filled 2023-10-09: qty 1

## 2023-10-09 MED ORDER — SODIUM CHLORIDE 0.9 % IV SOLN
1000.0000 mg | Freq: Once | INTRAVENOUS | Status: AC
  Administered 2023-10-09: 1000 mg via INTRAVENOUS
  Filled 2023-10-09: qty 16

## 2023-10-09 MED ORDER — LACTATED RINGERS IV SOLN: INTRAVENOUS | Status: DC | PRN

## 2023-10-09 MED ORDER — PHENYLEPHRINE 80 MCG/ML (10ML) SYRINGE FOR IV PUSH (FOR BLOOD PRESSURE SUPPORT)
PREFILLED_SYRINGE | INTRAVENOUS | Status: AC
  Filled 2023-10-09: qty 10

## 2023-10-09 MED ORDER — PHENYLEPHRINE HCL-NACL 20-0.9 MG/250ML-% IV SOLN
INTRAVENOUS | Status: DC | PRN
  Administered 2023-10-09: 40 ug/min via INTRAVENOUS

## 2023-10-09 MED ORDER — FUROSEMIDE 10 MG/ML IJ SOLN
10.0000 mg | Freq: Once | INTRAMUSCULAR | Status: AC
  Administered 2023-10-09: 10 mg via INTRAVENOUS
  Filled 2023-10-09: qty 2

## 2023-10-09 MED ORDER — FREE WATER
300.0000 mL | Status: DC
  Administered 2023-10-09 (×3): 300 mL

## 2023-10-09 MED ORDER — FUROSEMIDE 10 MG/ML IJ SOLN
20.0000 mg | Freq: Once | INTRAMUSCULAR | Status: AC
  Administered 2023-10-09: 20 mg via INTRAVENOUS
  Filled 2023-10-09: qty 2

## 2023-10-09 MED ORDER — ALBUMIN HUMAN 25 % IV SOLN
12.5000 g | Freq: Once | INTRAVENOUS | Status: AC
  Administered 2023-10-09: 12.5 g via INTRAVENOUS
  Filled 2023-10-09: qty 50

## 2023-10-09 MED ORDER — PHENYLEPHRINE 80 MCG/ML (10ML) SYRINGE FOR IV PUSH (FOR BLOOD PRESSURE SUPPORT)
PREFILLED_SYRINGE | INTRAVENOUS | Status: DC | PRN
  Administered 2023-10-09: 160 ug via INTRAVENOUS
  Administered 2023-10-09: 80 ug via INTRAVENOUS

## 2023-10-09 SURGICAL SUPPLY — 88 items
APPLIER CLIP 11 MED OPEN (CLIP) ×1
BAG COUNTER SPONGE SURGICOUNT (BAG) ×2 IMPLANT
BAG DECANTER FOR FLEXI CONT (MISCELLANEOUS) IMPLANT
BLADE CLIPPER SURG (BLADE) IMPLANT
BLADE SAW STERNAL (BLADE) ×2 IMPLANT
BLADE STERNUM SYSTEM 6 (BLADE) IMPLANT
BLADE SURG 10 STRL SS (BLADE) IMPLANT
CLIP APPLIE 11 MED OPEN (CLIP) ×2 IMPLANT
CLIP TI MEDIUM 24 (CLIP) IMPLANT
CLIP TI WIDE RED SMALL 24 (CLIP) IMPLANT
CNTNR URN SCR LID CUP LEK RST (MISCELLANEOUS) ×2 IMPLANT
COVER BACK TABLE 60X90IN (DRAPES) IMPLANT
COVER MAYO STAND STRL (DRAPES) IMPLANT
COVER SURGICAL LIGHT HANDLE (MISCELLANEOUS) ×2 IMPLANT
DRAPE HALF SHEET 40X57 (DRAPES) IMPLANT
DRAPE SLUSH MACHINE 52X66 (DRAPES) ×2 IMPLANT
DRSG COVADERM 4X10 (GAUZE/BANDAGES/DRESSINGS) IMPLANT
DRSG DERMACEA NONADH 3X8 (GAUZE/BANDAGES/DRESSINGS) IMPLANT
DRSG TELFA 3X8 NADH STRL (GAUZE/BANDAGES/DRESSINGS) ×2 IMPLANT
DURAPREP 26ML APPLICATOR (WOUND CARE) IMPLANT
ELECT BLADE 6.5 EXT (BLADE) IMPLANT
ELECT REM PT RETURN 9FT ADLT (ELECTROSURGICAL) ×2
ELECTRODE REM PT RTRN 9FT ADLT (ELECTROSURGICAL) ×4 IMPLANT
GAUZE 4X4 16PLY ~~LOC~~+RFID DBL (SPONGE) IMPLANT
GLOVE BIO SURGEON STRL SZ7 (GLOVE) IMPLANT
GLOVE BIO SURGEON STRL SZ7.5 (GLOVE) IMPLANT
GLOVE BIO SURGEON STRL SZ8 (GLOVE) IMPLANT
GLOVE BIO SURGEON STRL SZ8.5 (GLOVE) IMPLANT
GLOVE BIOGEL PI IND STRL 7.0 (GLOVE) IMPLANT
GLOVE BIOGEL PI IND STRL 7.5 (GLOVE) IMPLANT
GLOVE BIOGEL PI IND STRL 8 (GLOVE) IMPLANT
GLOVE BIOGEL PI IND STRL 8.5 (GLOVE) IMPLANT
GLOVE SURG SS PI 7.0 STRL IVOR (GLOVE) IMPLANT
GLOVE SURG SS PI 7.5 STRL IVOR (GLOVE) IMPLANT
GLOVE SURG SS PI 8.0 STRL IVOR (GLOVE) IMPLANT
GOWN STRL REUS W/ TWL LRG LVL3 (GOWN DISPOSABLE) ×8 IMPLANT
GOWN STRL REUS W/ TWL XL LVL3 (GOWN DISPOSABLE) ×4 IMPLANT
HANDLE SUCTION POOLE (INSTRUMENTS) IMPLANT
KIT POST MORTEM ADULT 36X90 (BAG) ×2 IMPLANT
KIT TURNOVER KIT B (KITS) ×2 IMPLANT
LOOP VASCLR MAXI BLUE 18IN ST (MISCELLANEOUS) IMPLANT
LOOPS VASCLR MAXI BLUE 18IN ST (MISCELLANEOUS) IMPLANT
MANIFOLD NEPTUNE II (INSTRUMENTS) ×2 IMPLANT
NDL BIOPSY 14X6 SOFT TISS (NEEDLE) IMPLANT
NEEDLE BIOPSY 14X6 SOFT TISS (NEEDLE) ×1 IMPLANT
NS IRRIG 1000ML POUR BTL (IV SOLUTION) IMPLANT
PACK AORTA (CUSTOM PROCEDURE TRAY) ×2 IMPLANT
PAD ARMBOARD 7.5X6 YLW CONV (MISCELLANEOUS) ×4 IMPLANT
PENCIL BUTTON HOLSTER BLD 10FT (ELECTRODE) ×2 IMPLANT
SOL PREP POV-IOD 4OZ 10% (MISCELLANEOUS) ×4 IMPLANT
SPONGE INTESTINAL PEANUT (DISPOSABLE) IMPLANT
SPONGE T-LAP 18X18 ~~LOC~~+RFID (SPONGE) IMPLANT
STAPLER PROXIMATE 100MM BLUE (MISCELLANEOUS) IMPLANT
STAPLER VISISTAT 35W (STAPLE) ×2 IMPLANT
SUCTION POOLE HANDLE (INSTRUMENTS)
SUT BONE WAX W31G (SUTURE) IMPLANT
SUT ETHIBOND 5 LR DA (SUTURE) IMPLANT
SUT ETHILON 1 LR 30 (SUTURE) ×4 IMPLANT
SUT ETHILON 2 LR (SUTURE) IMPLANT
SUT PROLENE 3 0 RB 1 (SUTURE) IMPLANT
SUT PROLENE 3 0 SH 1 (SUTURE) IMPLANT
SUT PROLENE 4-0 RB1 .5 CRCL 36 (SUTURE) IMPLANT
SUT PROLENE 5 0 C 1 24 (SUTURE) IMPLANT
SUT PROLENE 6 0 BV (SUTURE) IMPLANT
SUT SILK 0 TIES 10X30 (SUTURE) IMPLANT
SUT SILK 1 SH (SUTURE) IMPLANT
SUT SILK 1 TIES 10X30 (SUTURE) IMPLANT
SUT SILK 2 0 SH (SUTURE) IMPLANT
SUT SILK 2 0 SH CR/8 (SUTURE) IMPLANT
SUT SILK 2 0 TIES 10X30 (SUTURE) IMPLANT
SUT SILK 2-0 18XBRD TIE 12 (SUTURE) IMPLANT
SUT SILK 3 0 SH CR/8 (SUTURE) IMPLANT
SUT SILK 3 0 TIES 10X30 (SUTURE) IMPLANT
SUT VIC AB 3-0 SH 27X BRD (SUTURE) IMPLANT
SWAB COLLECTION DEVICE MRSA (MISCELLANEOUS) IMPLANT
SWAB CULTURE ESWAB REG 1ML (MISCELLANEOUS) IMPLANT
SYR 10ML LL (SYRINGE) IMPLANT
SYR 30ML SLIP (SYRINGE) IMPLANT
SYR 50ML LL SCALE MARK (SYRINGE) IMPLANT
SYR BULB IRRIG 60ML STRL (SYRINGE) IMPLANT
SYRINGE TOOMEY DISP (SYRINGE) IMPLANT
TAPE UMBILICAL 1/8 X36 TWILL (MISCELLANEOUS) IMPLANT
TUBE CONNECTING 12X1/4 (SUCTIONS) ×2 IMPLANT
TUBING EXTENTION W/L.L. (IV SETS) IMPLANT
VASCULAR TIE MAXI BLUE 18IN ST (MISCELLANEOUS)
VASCULAR TIE MINI RED 18IN STL (MISCELLANEOUS) IMPLANT
WATER STERILE IRR 1000ML POUR (IV SOLUTION) IMPLANT
YANKAUER SUCT BULB TIP NO VENT (SUCTIONS) ×2 IMPLANT

## 2023-10-10 LAB — CULTURE, BLOOD (ROUTINE X 2)
Culture: NO GROWTH
Culture: NO GROWTH
Special Requests: ADEQUATE
Special Requests: ADEQUATE

## 2023-10-11 ENCOUNTER — Encounter (HOSPITAL_COMMUNITY): Payer: Self-pay

## 2023-10-11 LAB — POCT I-STAT 7, (LYTES, BLD GAS, ICA,H+H)
Acid-base deficit: 4 mmol/L — ABNORMAL HIGH (ref 0.0–2.0)
Bicarbonate: 21.1 mmol/L (ref 20.0–28.0)
Calcium, Ion: 1.16 mmol/L (ref 1.15–1.40)
HCT: 31 % — ABNORMAL LOW (ref 39.0–52.0)
Hemoglobin: 10.5 g/dL — ABNORMAL LOW (ref 13.0–17.0)
O2 Saturation: 100 %
Potassium: 4 mmol/L (ref 3.5–5.1)
Sodium: 155 mmol/L — ABNORMAL HIGH (ref 135–145)
TCO2: 22 mmol/L (ref 22–32)
pCO2 arterial: 38.5 mm[Hg] (ref 32–48)
pH, Arterial: 7.347 — ABNORMAL LOW (ref 7.35–7.45)
pO2, Arterial: 527 mm[Hg] — ABNORMAL HIGH (ref 83–108)

## 2023-10-11 NOTE — Progress Notes (Signed)
NAME:  Ryan Silva, MRN:  [id number], DOB:  19-Mar-1966, LOS: 7 ADMISSION DATE:  10/02/2023, CONSULTATION DATE: 10/02/2023 REFERRING MD:  Rolla Flatten, MD , CHIEF COMPLAINT: Unresponsiveness  History of Present Illness:  57 year old male who was brought into the emergency department after he was found down by the bedside by his wife.  His last known well was 4 PM yesterday, upon arrival to ED patient was noted to be posturing, his GCS was 3 with systolic blood pressure ranging in 300s.  He was noted to have pinpoint pupils, he was given Narcan without much improvement, he was noted to vomiting from the mouth with a left gaze, he was immediately intubated.  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 CTA abdomen/pelvis/head and neck showed no aneurysm or aortic dissection. Patient was evaluated by neurosurgery, EVD was placed, evaluated by neurology, recommend admission to ICU  Pertinent  Medical History  Hypertension  Significant Hospital Events: Including procedures, antibiotic start and stop dates in addition to other pertinent events   11/22 EVD placed EEG 11/23 >> severe encephalopathy without any evidence of seizures or epileptiform discharges Echocardiogram 11/23 > apical cavity obliteration, LVEF 70-75% with moderate concentric LVH and grade 2 diastolic dysfunction.  RV size and function are normal 10-12-2023 Declared brain dead supported by NM brain perfusion scan  Interim History / Subjective:   Declared brain dead supported by NM brain perfusion scan 10-12-2023, plan for OR for organ procurement 11/29 4 PM  Objective   Blood pressure 125/72, pulse 62, temperature (!) 97.5 F (36.4 C), resp. rate (!) 22, height 5\' 10"  (1.778 m), weight 74.4 kg, SpO2 95%.    Vent Mode: PRVC FiO2 (%):  [40 %-100 %] 40 % Set Rate:   [22 bmp] 22 bmp Vt Set:  [500 mL] 500 mL PEEP:  [5 cmH20-10 cmH20] 10 cmH20 Plateau Pressure:  [14 cmH20-22 cmH20] 14 cmH20   Intake/Output Summary (Last 24 hours) at 09/21/2023 0924 Last data filed at 09/22/2023 0902 Gross per 24 hour  Intake 6129.43 ml  Output 4355 ml  Net 1774.43 ml   Filed Weights   2023-10-12 0444 10/08/23 0500 09/27/2023 0430  Weight: 69.1 kg 69.1 kg 74.4 kg    Examination: Gen:      Intubated, sedated, acutely ill appearing HEENT:  ETT to vent no cough, gag Lungs:    sounds of mechanical ventilation auscultated no wheeze CV:         RRR Abd:      + bowel sounds; soft, non-tender; no palpable masses, no distension Ext:    No edema Skin:      Warm and dry; no rashes Neuro:   off sedation, not responsive, GCS3 Absent cough and gag. No corneal reflex. Pupils are fixed and dilated with no response to light.. 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   CRITICAL CARE Performed by: Lesia Sago Levie Owensby   Total critical care time: 30 minutes  Critical care time was exclusive of separately billable procedures and treating other patients.  Critical care was necessary to treat or prevent imminent or life-threatening deterioration.  Critical care was time spent personally by me on the  following activities: development of treatment plan with patient and/or surrogate as well as nursing, discussions with consultants, evaluation of patient's response to treatment, examination of patient, obtaining history from patient or surrogate, ordering and performing treatments and interventions, ordering and review of laboratory studies, ordering and review of radiographic studies, pulse oximetry and re-evaluation of patient's condition.      Lesia Sago Courtnei Ruddell [facility] 09/13/2023 9:24 AM   see AMION If no response to pager , please call critical care on call (see AMION) until 7pm After 7:00 pm call Elink

## 2023-10-11 NOTE — Anesthesia Preprocedure Evaluation (Signed)
Anesthesia Evaluation  Patient identified by MRN, date of birth, ID band Patient awake and Patient unresponsive  General Assessment Comment:IVH with subsequent herniation Patient brain dead  Reviewed: H&P , Patient's Chart, lab work & pertinent test results, Unable to perform ROS - Chart review only  Airway Mallampati: Intubated  TM Distance: >3 FB Neck ROM: Full    Dental no notable dental hx.    Pulmonary neg pulmonary ROS   Pulmonary exam normal breath sounds clear to auscultation       Cardiovascular hypertension, Normal cardiovascular exam Rhythm:Regular Rate:Normal     Neuro/Psych negative neurological ROS  negative psych ROS   GI/Hepatic negative GI ROS, Neg liver ROS,,,  Endo/Other  negative endocrine ROS    Renal/GU negative Renal ROS  negative genitourinary   Musculoskeletal negative musculoskeletal ROS (+)    Abdominal   Peds negative pediatric ROS (+)  Hematology negative hematology ROS (+)   Anesthesia Other Findings   Reproductive/Obstetrics negative OB ROS                             Anesthesia Physical Anesthesia Plan  ASA: 6  Anesthesia Plan: General   Post-op Pain Management:    Induction: Intravenous  PONV Risk Score and Plan:   Airway Management Planned: Oral ETT  Additional Equipment:   Intra-op Plan:   Post-operative Plan:   Informed Consent: I have reviewed the patients History and Physical, chart, labs and discussed the procedure including the risks, benefits and alternatives for the proposed anesthesia with the patient or authorized representative who has indicated his/her understanding and acceptance.     Dental advisory given  Plan Discussed with: CRNA and Surgeon  Anesthesia Plan Comments: (Organ donor patient, declared brain dead)       Anesthesia Quick Evaluation

## 2023-10-11 NOTE — Death Summary Note (Signed)
DEATH SUMMARY   Patient Details  Name: Ryan Silva MRN: [id number] DOB: February 01, 1966  Admission/Discharge Information   Admit Date:  09/15/2023  Date of Death: Date of Death: 14-Oct-2023  Time of Death: Time of Death: 1049  Length of Stay: 7  Referring Physician: Default, Provider, MD   Reason(s) for Hospitalization  IVH  Diagnoses  Preliminary cause of death: Brain death due to massive intracerebral with intraventricular extension Secondary Diagnoses (including complications and co-morbidities):  Principal Problem:   IVH (intraventricular hemorrhage) (HCC) Active Problems:   Nontraumatic intracerebral hemorrhage [facility])   Brief Hospital Course (including significant findings, care, treatment, and services provided and events leading to death)  Ryan Silva is a 57 y.o. year old male who was found down by his wife GCS 3 in the ED intubated.  Subsidy found to have massive intracerebral hemorrhage with extension to the intraventricular space.  ICH score of 5.  Progressive worsening neurologic exam despite aggressive measures.  Suddenly pronounced brain dead 14-Oct-2023 supported by nuclear medicine brain scan.  He was deemed to be suitable candidate for organ donation.  Organ procurement was 10/05/2023.    Pertinent Labs and Studies  Significant Diagnostic Studies DG CHEST PORT 1 VIEW  Result Date: 09/13/2023 CLINICAL DATA:  Organ donation evaluation EXAM: PORTABLE CHEST 1 VIEW COMPARISON:  2023/10/14 FINDINGS: Endotracheal tube seen 6.1 cm above the carina. Nasogastric tube extends into the upper abdomen beyond the margin of the examination. Left internal jugular central venous catheter tip seen within the superior vena cava. Pulmonary insufflation is stable. Mild elevation of the right hemidiaphragm is unchanged. Small bilateral pleural effusions are present. No focal consolidation. Basilar common hepatic consolidation noted within the right lower lobe on CT examination 10-14-2023 is not  well appreciated on this examination. No pneumothorax. Cardiac size within normal limits. Pulmonary vascularity is normal. No acute bone abnormality. IMPRESSION: 1. Support lines and tubes in appropriate position. 2. Small bilateral pleural effusions. 3. Basilar consolidation noted within the right lower lobe on CT examination 10-14-23 is not well appreciated on this examination. Electronically Signed   By: Helyn Numbers M.D.   On: 09/13/2023 00:30   US BIOPSY (LIVER)  Result Date: 10/08/2023 INDICATION: 57 year old male organ donor, none survival brain injury, referred for medical liver biopsy EXAM: IMAGE GUIDED MEDICAL LIVER BIOPSY MEDICATIONS: None. ANESTHESIA/SEDATION: None COMPLICATIONS: None PROCEDURE: Informed written consent was presumed per the organ transplant documentation. Maximal Sterile Barrier Technique was utilized including caps, mask, sterile gowns, sterile gloves, sterile drape, hand hygiene and skin antiseptic. A timeout was performed prior to the initiation of the procedure. Ultrasound survey of the right liver lobe performed with images stored and sent to PACs. The right lower thorax/right upper abdomen was prepped with chlorhexidine in a sterile fashion, and a sterile drape was applied covering the operative field. A sterile gown and sterile gloves were used for the procedure. The patient was prepped and draped sterilely. A 17 gauge introducer needle was then advanced under ultrasound guidance in an intercostal location into the right liver lobe. The stylet was removed, and multiple separate 18 gauge core biopsy were retrieved. Samples were placed into formalin for transportation to the lab. Gel-Foam pledgets were then infused with a small amount of saline for assistance with hemostasis. The needle was removed, and a final ultrasound image was performed. The patient tolerated the procedure well and remained hemodynamically stable throughout. No complications were encountered and no  significant blood loss was encounter. complications were encountered and no significant blood  loss encountered. IMPRESSION: Status post ultrasound-guided medical liver biopsy. Signed, Yvone Neu. Miachel Roux, RPVI [facility] Electronically Signed   By: Gilmer Mor D.O.   On: 10/08/2023 11:26   DG Chest Portable 1 View  Result Date: 09/17/2023 CLINICAL DATA:  Organ function for transplant EXAM: PORTABLE CHEST 1 VIEW COMPARISON:  09/19/2023, 10/06/2023 FINDINGS: Endotracheal tube tip is about 3.6 cm superior to carina. Esophageal tube tip below the diaphragm but incompletely visualized. Similar small right effusion. Mild airspace disease medial right base without significant change compared to most recent prior. Stable cardiomediastinal silhouette. No pneumothorax. Left sided central venous catheter tip over the SVC IMPRESSION: 1. Support lines and tubes as above 2. No significant change in small right effusion and mild right infrahilar airspace disease. Electronically Signed   By: Jasmine Pang M.D.   On: 09/27/2023 21:06   DG Chest Portable 1 View  Result Date: 10/06/2023 CLINICAL DATA:  Organ donation EXAM: PORTABLE CHEST 1 VIEW COMPARISON:  10/06/2023, 10/06/2023 FINDINGS: Endotracheal tube tip is about 4 cm superior to carina. Esophageal tube tip below the diaphragm but incompletely visualized. Left IJ central venous catheter tip over the SVC. Clear left lung fields. Suspicion of small right effusion. Improved right basilar aeration with residual hazy airspace disease. Normal cardiac size. IMPRESSION: 1. Improved right basilar aeration with residual hazy airspace disease and suspicion of small right effusion. 2. Support apparatus as above. Electronically Signed   By: Jasmine Pang M.D.   On: 09/26/2023 18:12   DG CHEST PORT 1 VIEW  Result Date: 10/03/2023 CLINICAL DATA:  Organ donor EXAM: PORTABLE CHEST 1 VIEW COMPARISON:  CT  09/27/2023, 10/06/2023 FINDINGS: Endotracheal tube tip is about 3.3 cm superior to the carina. Left IJ central venous catheter tip at the SVC. Esophageal tube tip below the diaphragm but incompletely visualized. Normal cardiac size. Small right-sided pleural effusion and right basilar airspace disease. No pneumothorax IMPRESSION: 1. Small right pleural effusion and right basilar airspace disease. 2. Support apparatus as above. Electronically Signed   By: Jasmine Pang M.D.   On: 10/03/2023 16:47   CT CHEST ABDOMEN PELVIS WO CONTRAST  Result Date: 10/03/2023 CLINICAL DATA:  Organ donation. Intracranial hemorrhage. Clinical brain death EXAM: CT CHEST, ABDOMEN AND PELVIS WITHOUT CONTRAST TECHNIQUE: Multidetector CT imaging of the chest, abdomen and pelvis was performed following the standard protocol without IV contrast. RADIATION DOSE REDUCTION: This exam was performed according to the departmental dose-optimization program which includes automated exposure control, adjustment of the mA and/or kV according to patient size and/or use of iterative reconstruction technique. COMPARISON:  None Available. FINDINGS: CT CHEST FINDINGS Cardiovascular: No acute findings noncontrast CT. Central venous line tip in distal SVC. No pericardial effusion Mediastinum/Nodes: Endotracheal tube noted. NG tube extends the stomach. No lymphadenopathy Lungs/Pleura: Bilateral small effusions. Consolidation in the RIGHT lower lobe. No pneumothorax. No pulmonary nodularity. Musculoskeletal: No aggressive osseous lesion. CT ABDOMEN AND PELVIS FINDINGS Hepatobiliary: Normal liver on noncontrast exam. Gallbladder normal. Pancreas: Pancreas is normal. No ductal dilatation. No pancreatic inflammation. Spleen: Normal spleen Adrenals/urinary tract: Adrenal glands and kidneys are normal. The ureters and bladder normal. Foley catheter in bladder. Stomach/Bowel: NG tube in stomach. Small bowel: Unremarkable. Appendix normal. Vascular/Lymphatic:  Atherosclerotic calcification of the aorta. Reproductive: Unremarkable Other: No free fluid. Musculoskeletal: No aggressive osseous lesion. IMPRESSION: CHEST: 1. Bilateral small effusions. 2. Consolidation in the RIGHT lower lobe. PELVIS: 1. No acute findings in the abdomen pelvis on noncontrast exam. No evidence  of malignancy. 2.  Aortic Atherosclerosis (ICD10-I70.0). Electronically Signed   By: Genevive Bi M.D.   On: 09/28/2023 13:54   NM Brain W Vasc Flow Min 4V  Result Date: 09/14/2023 CLINICAL DATA:  Intracranial hemorrhage. Brain death 5 neurologic exam. EXAM: NM BRAIN SCAN WITH FLOW - 4+ VIEW TECHNIQUE: Radionuclide angiogram and static images of the brain were obtained after intravenous injection of radiopharmaceutical. RADIOPHARMACEUTICALS:  20.0 millicuries technetium 99  Exametazime COMPARISON:  CT angio head 10/02/2023 FINDINGS: Initial vascular phase imaging demonstrates adequate radiotracer bolus with flow demonstrated within the extracranial carotid arteries. Delayed imaging demonstrates no radiotracer activity within LEFT or RIGHT cerebral hemisphere. Findings consistent with absence of cerebral blood flow. IMPRESSION: Absence of cerebral blood flow by nuclear medicine brain perfusion imaging. Electronically Signed   By: Genevive Bi M.D.   On: 10/01/2023 10:49   DG CHEST PORT 1 VIEW  Result Date: 10/06/2023 CLINICAL DATA:  [id number], obtained for organ donation. EXAM: PORTABLE CHEST 1 VIEW COMPARISON:  Portable chest 10/02/2023 FINDINGS: 10:57 p.m. ETT tip is 4.3 cm from the carina. NGT passes well into the stomach but neither the side-hole or the tip are filmed. Mild cardiomegaly. There is central vascular prominence without overt edema. The lungs are clear of focal infiltrates. There is no substantial pleural effusion. The mediastinum is normally outlined.  No new osseous findings. IMPRESSION: 1. ETT tip 4.3 cm from the carina. 2. NGT passes well into the stomach but neither the  side-hole or the tip are in the field. 3. Mild cardiomegaly with central vascular prominence without overt edema or focal pneumonia. Electronically Signed   By: Almira Bar M.D.   On: 10/06/2023 02:52   DG CHEST PORT 1 VIEW  Result Date: 10/06/2023 CLINICAL DATA:  Central line EXAM: PORTABLE CHEST 1 VIEW COMPARISON:  Chest x-ray 10/05/2023 FINDINGS: Endotracheal tube tip is 3.7 cm above the carina. New left-sided central venous catheter tip ends in the distal SVC. Enteric tube extends below the diaphragm. The heart is mildly enlarged. There central pulmonary vascular congestion. There is no focal lung consolidation. There is minimal bibasilar atelectasis. There is no pleural effusion or pneumothorax. The osseous structures are stable. IMPRESSION: 1. New left-sided central venous catheter tip ends in the distal SVC. No pneumothorax. 2. Mild cardiomegaly with central pulmonary vascular congestion. Electronically Signed   By: Darliss Cheney M.D.   On: 10/06/2023 01:43   ECHOCARDIOGRAM COMPLETE  Result Date: 10/03/2023    ECHOCARDIOGRAM REPORT   Patient Name:   Ryan Silva Date of Exam: 10/03/2023 Medical Rec #:  [id number]     Height:       70.0 in Accession #:    [id number]    Weight:       150.8 lb Date of Birth:  1966-01-08      BSA:          1.851 m Patient Age:    57 years      BP:           161/70 mmHg Patient Gender: M             HR:           77 bpm. Exam Location:  Inpatient Procedure: 2D Echo, Color Doppler, Cardiac Doppler, Strain Analysis and            Intracardiac Opacification Agent Indications:    Stroke  History:        Patient has no prior history of Echocardiogram examinations.  Risk Factors:Current Smoker.  Sonographer:    Raeford Razor RDCS Referring Phys: [id number] SUDHAM CHAND IMPRESSIONS  1. Apical cavity obliteration. Left ventricular ejection fraction, by estimation, is 70 to 75%. The left ventricle has hyperdynamic function. The left ventricle has no regional wall motion  abnormalities. There is moderate concentric left ventricular hypertrophy. Left ventricular diastolic parameters are consistent with Grade II diastolic dysfunction (pseudonormalization).  2. Right ventricular systolic function is normal. The right ventricular size is normal.  3. Left atrial size was mildly dilated.  4. The mitral valve is normal in structure. No evidence of mitral valve regurgitation. No evidence of mitral stenosis.  5. The aortic valve is normal in structure. Aortic valve regurgitation is not visualized. No aortic stenosis is present.  6. Aortic dilatation noted. There is mild dilatation of the ascending aorta, measuring 40 mm.  7. The inferior vena cava is dilated in size with <50% respiratory variability, suggesting right atrial pressure of 15 mmHg. Conclusion(s)/Recommendation(s): Findings consistent with hypertrophic cardiomyopathy. No intracardiac source of embolism detected on this transthoracic study. Consider a transesophageal echocardiogram to exclude cardiac source of embolism if clinically indicated. FINDINGS  Left Ventricle: Apical cavity obliteration. Left ventricular ejection fraction, by estimation, is 70 to 75%. The left ventricle has hyperdynamic function. The left ventricle has no regional wall motion abnormalities. Definity contrast agent was given IV  to delineate the left ventricular endocardial borders. The left ventricular internal cavity size was normal in size. There is moderate concentric left ventricular hypertrophy. Left ventricular diastolic parameters are consistent with Grade II diastolic dysfunction (pseudonormalization). Right Ventricle: The right ventricular size is normal. No increase in right ventricular wall thickness. Right ventricular systolic function is normal. Left Atrium: Left atrial size was mildly dilated. Right Atrium: Right atrial size was normal in size. Pericardium: There is no evidence of pericardial effusion. Mitral Valve: The mitral valve is normal  in structure. No evidence of mitral valve regurgitation. No evidence of mitral valve stenosis. Tricuspid Valve: The tricuspid valve is normal in structure. Tricuspid valve regurgitation is trivial. No evidence of tricuspid stenosis. Aortic Valve: The aortic valve is normal in structure. Aortic valve regurgitation is not visualized. No aortic stenosis is present. Aortic valve mean gradient measures 4.0 mmHg. Aortic valve peak gradient measures 7.8 mmHg. Aortic valve area, by VTI measures 2.10 cm. Pulmonic Valve: The pulmonic valve was normal in structure. Pulmonic valve regurgitation is not visualized. No evidence of pulmonic stenosis. Aorta: Aortic dilatation noted. There is mild dilatation of the ascending aorta, measuring 40 mm. Venous: The inferior vena cava is dilated in size with less than 50% respiratory variability, suggesting right atrial pressure of 15 mmHg. IAS/Shunts: No atrial level shunt detected by color flow Doppler.  LEFT VENTRICLE PLAX 2D LVIDd:         5.00 cm      Diastology LVIDs:         2.20 cm      LV e' medial:    5.00 cm/s LV PW:         0.80 cm      LV E/e' medial:  15.7 LV IVS:        1.20 cm      LV e' lateral:   4.79 cm/s LVOT diam:     1.70 cm      LV E/e' lateral: 16.4 LV SV:         51 LV SV Index:   27  2D Longitudinal Strain LVOT Area:     2.27 cm     2D Strain GLS Avg:     -18.5 %  LV Volumes (MOD) LV vol d, MOD A2C: 166.0 ml LV vol d, MOD A4C: 122.0 ml LV vol s, MOD A2C: 59.5 ml LV vol s, MOD A4C: 40.1 ml LV SV MOD A2C:     106.5 ml LV SV MOD A4C:     122.0 ml LV SV MOD BP:      93.7 ml RIGHT VENTRICLE             IVC RV Basal diam:  3.10 cm     IVC diam: 2.20 cm RV S prime:     21.40 cm/s TAPSE (M-mode): 2.3 cm LEFT ATRIUM             Index        RIGHT ATRIUM           Index LA diam:        4.30 cm 2.32 cm/m   RA Area:     15.00 cm LA Vol (A2C):   49.5 ml 26.74 ml/m  RA Volume:   32.00 ml  17.29 ml/m LA Vol (A4C):   58.5 ml 31.60 ml/m LA Biplane Vol: 55.2 ml  29.82 ml/m  AORTIC VALVE AV Area (Vmax):    1.93 cm AV Area (Vmean):   1.95 cm AV Area (VTI):     2.10 cm AV Vmax:           140.00 cm/s AV Vmean:          88.400 cm/s AV VTI:            0.241 m AV Peak Grad:      7.8 mmHg AV Mean Grad:      4.0 mmHg LVOT Vmax:         119.00 cm/s LVOT Vmean:        76.000 cm/s LVOT VTI:          0.223 m LVOT/AV VTI ratio: 0.93  AORTA Ao Root diam: 3.40 cm Ao Asc diam:  4.00 cm MITRAL VALVE MV Area (PHT): 3.72 cm    SHUNTS MV Decel Time: 204 msec    Systemic VTI:  0.22 m MV E velocity: 78.60 cm/s  Systemic Diam: 1.70 cm MV A velocity: 51.90 cm/s MV E/A ratio:  1.51 Donato Schultz MD Electronically signed by Donato Schultz MD Signature Date/Time: 10/03/2023/1:06:33 PM    Final    EEG adult  Result Date: 10/03/2023 Charlsie Quest, MD     10/03/2023  8:52 AM Patient Name: Ryan Silva MRN: [id number] Epilepsy Attending: Charlsie Quest Referring Physician/Provider: Rejeana Brock, MD Date: 10/03/2023 Duration: 23.23 mins Patient history: 57 yo m with lip twitching getting eeg to evaluate for  seizure Level of alertness:  comatose AEDs during EEG study: LEV, propofol Technical aspects: This EEG study was done with scalp electrodes positioned according to the 10-20 International system of electrode placement. Electrical activity was reviewed with band pass filter of 1-70Hz , sensitivity of 7 uV/mm, display speed of 12mm/sec with a 60Hz  notched filter applied as appropriate. EEG data were recorded continuously and digitally stored.  Video monitoring was available and reviewed as appropriate. Description:  EEG showed burst suppression with bursts of generalized polymorphic sharply contoured 3 to 6 Hz theta-delta slowing lasting 1-3 seconds alternating with generalized attenuation lasting 3-7 seconds. Hyperventilation and photic stimulation were not performed.    ABNORMALITY -  Burst attenuation, generalized  IMPRESSION: This study is suggestive of severe to profound diffuse  encephalopathy. No seizures or epileptiform discharges were seen throughout the recording.  Priyanka Annabelle Harman   CT HEAD WO CONTRAST ( )  Result Date: 10/03/2023 CLINICAL DATA:  Stroke, follow-up EXAM: CT HEAD WITHOUT CONTRAST TECHNIQUE: Contiguous axial images were obtained from the base of the skull through the vertex without intravenous contrast. RADIATION DOSE REDUCTION: This exam was performed according to the departmental dose-optimization program which includes automated exposure control, adjustment of the mA and/or kV according to patient size and/or use of iterative reconstruction technique. COMPARISON:  10/02/2023 CT head FINDINGS: Brain: Redemonstrated large l intraparenchymal hematoma centered in the left basal ganglia, which measures up to 7.2 x 3.5 x 4.1 cm (AP x TR x CC) (series 6, image 47 and series 5, image 39), previously 7.9 x 3.9 x 5.4 cm when remeasured similarly. Increased diffusivity previously noted subarachnoid hemorrhage, with likely similar volume of intraventricular hemorrhage. Interval placement of a right frontal approach ventricular catheter, which terminates in the anterior body of the right lateral ventricle. Slightly decreased size the right lateral ventricle. 6 mm of left-to-right midline shift, previously 10 mm. The basilar cisterns remain effaced. No definite tonsillar herniation. Again noted is hypodensity in the pons. Likely transependymal edema. Vascular: No hyperdense vessel. Skull: Right frontal burr hole. Negative for acute fracture or suspicious osseous lesion. Sinuses/Orbits: Mucous retention cysts in the maxillary sinuses. Mucosal thickening in the ethmoid air cells. Redemonstrated dysconjugate gaze. No acute finding in the orbits. IMPRESSION: 1. Redemonstrated large intraparenchymal hematoma centered in the left basal ganglia, which measures up to 7.2 cm, slightly decreased from prior exam 2. Interval placement of a right frontal approach ventricular catheter,  which terminates in the anterior body of the right lateral ventricle. Slightly decreased size of the right lateral ventricle. 3. 6 mm of left-to-right midline shift, previously 10 mm. The basilar cisterns remain effaced. No definite herniation. Electronically Signed   By: Wiliam Ke M.D.   On: 10/03/2023 03:25   DG CHEST PORT 1 VIEW  Result Date: 10/02/2023 CLINICAL DATA:  Abnormal respiration. EXAM: PORTABLE CHEST 1 VIEW COMPARISON:  None Available. FINDINGS: The heart size and mediastinal contours are within normal limits. Endotracheal and nasogastric tubes are grossly good position. Both lungs are clear. The visualized skeletal structures are unremarkable. IMPRESSION: No active disease. Electronically Signed   By: Lupita Raider M.D.   On: 10/02/2023 12:30   DG Abd 1 View  Result Date: 10/02/2023 CLINICAL DATA:  Altered mental status, abnormal respiration. EXAM: ABDOMEN - 1 VIEW COMPARISON:  None Available. FINDINGS: The bowel gas pattern is normal. Nasogastric tube tip is seen in proximal stomach. No radio-opaque calculi or other significant radiographic abnormality are seen. IMPRESSION: No abnormal bowel dilatation. Electronically Signed   By: Lupita Raider M.D.   On: 10/02/2023 12:29   CT ANGIO HEAD NECK W WO CM (CODE STROKE)  Result Date: 10/02/2023 CLINICAL DATA:  57 year old male code stroke presentation. Neurologic deficit. Chest pain. Sepsis. EXAM: CT ANGIOGRAPHY HEAD AND NECK WITH AND WITHOUT CONTRAST TECHNIQUE: Multidetector CT imaging of the head and neck was performed using the standard protocol during bolus administration of intravenous contrast. Multiplanar CT image reconstructions and MIPs were obtained to evaluate the vascular anatomy. Carotid stenosis measurements (when applicable) are obtained utilizing NASCET criteria, using the distal internal carotid diameter as the denominator. RADIATION DOSE REDUCTION: This exam was performed according to the departmental  dose-optimization program which  includes automated exposure control, adjustment of the mA and/or kV according to patient size and/or use of iterative reconstruction technique. CONTRAST:  OMNIPAQUE IOHEXOL 300 MG/ML  SOLN COMPARISON:  None Available. FINDINGS: CT HEAD Brain: Severe intracranial hemorrhage. Large volume left hemisphere hyperdense intra-axial blood, epicenter at the left basal ganglia. Fairly extensive intraventricular and extra-axial blood extension. Estimated intra-axial blood volume is 97 mL (78 x 40 x 62 mm (AP by transverse by CC)). Large volume IVH. And moderate volume subarachnoid hemorrhage including in the basilar cisterns greater on the left, in the left sylvian fissure. There could be direct extension from the intra-axial blood to the subarachnoid space on series 2, image 16. Intracranial mass effect with effaced ventricles, rightward midline shift of 10 mm. Trapped occipital horns and right temporal horn. Transependymal edema. Effaced basilar cisterns. No definite tonsillar herniation. Brainstem seems hypodense on series 2, image 13, but might be artifactual due to the surrounding hyperdense blood. There is evidence of transependymal edema in the cerebral hemispheres, probably also advanced bilateral underlying white matter hypodensity. Calvarium and skull base: No skull fracture identified. Paranasal sinuses: Intubated. Well aerated paranasal sinuses, mastoids, tympanic cavities. Orbits: Disconjugate gaze. No discrete orbit or scalp soft tissue injury. Preliminary report of the above discussed by telephone with Dr. Derry Lory on 10/02/2023 at 0958 hours CTA NECK Skeleton: No acute osseous abnormality identified. Upper chest: Intubated. Endotracheal tube tip terminates above the carina. Small to moderate volume retained secretions in the lower trachea and carina but overall the visible major airways are patent. Relatively mild dependent atelectasis in the visible lungs. Central  pulmonary arteries are enhancing and patent. No superior mediastinal mass or lymphadenopathy. Other neck: Intubated with fluid in the pharynx. Otherwise negative. Aortic arch: 3 vessel arch. Mild arch atherosclerosis. Partially visible tortuosity of the descending thoracic aorta, up to 34 mm diameter. Right carotid system: Brachiocephalic artery and right CCA origin are normal. Negative right CCA. Patent right carotid bifurcation with minimal irregularity. Patent right ICA to the skull base with mild tortuosity, no plaque or stenosis. Left carotid system: Similar mild tortuosity with no CCA plaque. Mild soft and calcified plaque at the left ICA origin. But bulky soft and calcified plaque at the distal left ICA bulb as seen on series 7, image 100 and series 12, image 33. Subsequent stenosis there numerically estimated at 65 % with respect to the distal vessel. Left ICA remains patent to the skull base. Vertebral arteries: Proximal right subclavian artery is patent with no significant plaque or stenosis. However, there is moderate to severe soft plaque and stenosis at the right vertebral artery origin on series 8, image 148. Right vertebral artery remains patent with mild additional V1 irregularity. Right vertebral artery remains enhancing to the skull base although with diminishing contrast density compared to the left side. See intracranial findings below. Proximal left subclavian artery and left vertebral artery origin are normal. Codominant left vertebral artery is enhancing and appears normal to the skull base. CTA HEAD Posterior circulation: Attenuated bilateral distal vertebral arteries, but earlier loss of enhancement in the right V4 segment as seen on series 5, image 153. Distal to the patent left PICA the left vertebral is irregular and stenotic. The vertebrobasilar junction is stenotic. The basilar artery is stenotic and irregular. But enhancement continues to the basilar tip as seen on series 11, image 33.  SCA and PCA origins are patent. But irregular. There is symmetric bilateral PCA branch enhancement which is irregular. Anterior circulation: Right ICA siphon  is patent to the terminus with calcified siphon atherosclerosis. Mild to moderate right siphon stenosis. Contralateral left ICA siphon is enhancing but asymmetrically attenuated especially in the supraclinoid segment which appears moderately to severely stenotic series 9, image 147. Underlying moderate left siphon plaque elsewhere. MCA and ACA origins are patent but irregular (series 11, image 35). ACA branch enhancement is moderately attenuated but is maintained. See series 12, image 30. Bilateral MCA branch enhance mint similarly maintained but attenuated in a fairly symmetric pattern (series 10, image 20). There is mass effect on the left MCA branches from the intra-axial hemorrhage. No CTA spot sign. No intracranial aneurysm is identified. Posterior communicating arteries are diminutive or absent. Venous sinuses: Not evaluated due to early contrast timing. Anatomic variants: None significant. Review of the MIP images confirms the above findings IMPRESSION: 1. Severe Intracranial Hemorrhage: Large volume Left Basal ganglia bleed (97 mL) with extensive IVH and SAH. Intracranial mass effect with rightward midline shift of 10 mm. Combined trapped and effaced ventricles. Transependymal edema. Effaced basilar cisterns. This was discussed with Dr.  Derry Lory on 10/02/2023 at 0958 hours. 2. Intracranial CTA is remarkable for significantly attenuated - but not lost - enhancement of all anterior and posterior circulation branches. Differential considerations include diffuse intracranial vasospasm versus increasing intracranial pressure. This was communicated to Dr. Derry Lory at 1007 hours via the [facility] messaging system. 3. Negative for CTA spot sign.  No intracranial aneurysm identified. Positive for age advanced atherosclerosis with hemodynamically significant  stenoses: - left ICA siphon moderate stenosis. - left ICA bulb 65% stenosis. - right ICA siphon mild to moderate stenosis. - Severe stenosis of the Right Vertebral Artery origin. 4. Satisfactory endotracheal tube position. CTA chest abdomen pelvis reported separately. Electronically Signed   By: Odessa Fleming M.D.   On: 10/02/2023 10:33   CT CHEST ABDOMEN PELVIS WO CONTRAST  Result Date: 10/02/2023 CLINICAL DATA:  57 year old male code stroke presentation, intracranial hemorrhage. Chest pain. Sepsis. EXAM: CT CHEST, ABDOMEN AND PELVIS WITHOUT CONTRAST CT ANGIOGRAPHY CHEST, ABDOMEN AND PELVIS TECHNIQUE: Non-contrast CT of the chest abdomen and pelvis was initially obtained at 0950 hours. Multidetector CT imaging through the chest, abdomen and pelvis was performed at 1006 hours using the standard protocol during bolus administration of intravenous contrast. Multiplanar reconstructed images and MIPs were obtained and reviewed to evaluate the vascular anatomy. RADIATION DOSE REDUCTION: This exam was performed according to the departmental dose-optimization program which includes automated exposure control, adjustment of the mA and/or kV according to patient size and/or use of iterative reconstruction technique. CONTRAST:  75mL OMNIPAQUE IOHEXOL 300 MG/ML  SOLN COMPARISON:  CTA head and neck today reported separately. FINDINGS: CT/CTA CHEST FINDINGS Cardiovascular: Heart size within normal limits. No pericardial effusion. Some calcified coronary artery plaque prior to contrast series 3, image 34. Mild tortuosity and atherosclerosis of the thoracic aorta. Negative for thoracic aortic dissection or aneurysm. Central pulmonary artery enhancement better demonstrated on the neck CTA reported separately. Mediastinum/Nodes: Negative. No mediastinal mass or lymphadenopathy. Lungs/Pleura: Intubated on both imaging sets. Unchanged mild to moderate dependent secretions in the lower trachea and at the carina. Major airways remain  patent. Dependent and enhancing atelectasis in the lower lobes appears unchanged on the pre versus postcontrast images. No pleural effusion, pneumothorax, or consolidation. Musculoskeletal: No displaced rib fracture identified. Thoracic spine, visible shoulder osseous structures and sternum appear intact. Review of the MIP images confirms the above findings. CT/CTA ABDOMEN AND PELVIS FINDINGS VASCULAR Aortoiliac calcified atherosclerosis. Negative for abdominal aortic  aneurysm or dissection. Major arterial structures in the abdomen and pelvis are patent. Portal venous system not evaluated. Review of the MIP images confirms the above findings. NON-VASCULAR Hepatobiliary: Negative noncontrast and early postcontrast appearance of the liver and gallbladder. Pancreas: Negative. Spleen: Negative. Adrenals/Urinary Tract: Normal adrenal glands. Nonobstructed kidneys with symmetric renal enhancement and contrast excretion. Excreted contrast in the bladder on the 2nd imaging set. Stomach/Bowel: Mostly decompressed large and small bowel. Negative retrocecal appendix. Decompressed stomach and duodenum. No free air or free fluid. Somewhat indistinct appearance of the right colon on both sets of images but no convincing mesenteric inflammation. Lymphatic: No evidence of lymphadenopathy. Reproductive: Negative. Other: No pelvis free fluid. Musculoskeletal: No acute osseous abnormality identified. Review of the MIP images confirms the above findings. IMPRESSION: 1. Mildly tortuous aorta with Aortic Atherosclerosis (ICD10-I70.0). Negative for Aortic Dissection or Aneurysm. 2. Intubated with mild dependent atelectasis. 3. No other acute or inflammatory process identified in the chest, abdomen, or pelvis on noncontrast CT or subsequent CTA. Electronically Signed   By: Odessa Fleming M.D.   On: 10/02/2023 10:32   CT Angio Chest/Abd/Pel for Dissection W and/or Wo Contrast  Result Date: 10/02/2023 CLINICAL DATA:  58 year old male code  stroke presentation, intracranial hemorrhage. Chest pain. Sepsis. EXAM: CT CHEST, ABDOMEN AND PELVIS WITHOUT CONTRAST CT ANGIOGRAPHY CHEST, ABDOMEN AND PELVIS TECHNIQUE: Non-contrast CT of the chest abdomen and pelvis was initially obtained at 0950 hours. Multidetector CT imaging through the chest, abdomen and pelvis was performed at 1006 hours using the standard protocol during bolus administration of intravenous contrast. Multiplanar reconstructed images and MIPs were obtained and reviewed to evaluate the vascular anatomy. RADIATION DOSE REDUCTION: This exam was performed according to the departmental dose-optimization program which includes automated exposure control, adjustment of the mA and/or kV according to patient size and/or use of iterative reconstruction technique. CONTRAST:  75mL OMNIPAQUE IOHEXOL 300 MG/ML  SOLN COMPARISON:  CTA head and neck today reported separately. FINDINGS: CT/CTA CHEST FINDINGS Cardiovascular: Heart size within normal limits. No pericardial effusion. Some calcified coronary artery plaque prior to contrast series 3, image 34. Mild tortuosity and atherosclerosis of the thoracic aorta. Negative for thoracic aortic dissection or aneurysm. Central pulmonary artery enhancement better demonstrated on the neck CTA reported separately. Mediastinum/Nodes: Negative. No mediastinal mass or lymphadenopathy. Lungs/Pleura: Intubated on both imaging sets. Unchanged mild to moderate dependent secretions in the lower trachea and at the carina. Major airways remain patent. Dependent and enhancing atelectasis in the lower lobes appears unchanged on the pre versus postcontrast images. No pleural effusion, pneumothorax, or consolidation. Musculoskeletal: No displaced rib fracture identified. Thoracic spine, visible shoulder osseous structures and sternum appear intact. Review of the MIP images confirms the above findings. CT/CTA ABDOMEN AND PELVIS FINDINGS VASCULAR Aortoiliac calcified atherosclerosis.  Negative for abdominal aortic aneurysm or dissection. Major arterial structures in the abdomen and pelvis are patent. Portal venous system not evaluated. Review of the MIP images confirms the above findings. NON-VASCULAR Hepatobiliary: Negative noncontrast and early postcontrast appearance of the liver and gallbladder. Pancreas: Negative. Spleen: Negative. Adrenals/Urinary Tract: Normal adrenal glands. Nonobstructed kidneys with symmetric renal enhancement and contrast excretion. Excreted contrast in the bladder on the 2nd imaging set. Stomach/Bowel: Mostly decompressed large and small bowel. Negative retrocecal appendix. Decompressed stomach and duodenum. No free air or free fluid. Somewhat indistinct appearance of the right colon on both sets of images but no convincing mesenteric inflammation. Lymphatic: No evidence of lymphadenopathy. Reproductive: Negative. Other: No pelvis free fluid. Musculoskeletal: No  acute osseous abnormality identified. Review of the MIP images confirms the above findings. IMPRESSION: 1. Mildly tortuous aorta with Aortic Atherosclerosis (ICD10-I70.0). Negative for Aortic Dissection or Aneurysm. 2. Intubated with mild dependent atelectasis. 3. No other acute or inflammatory process identified in the chest, abdomen, or pelvis on noncontrast CT or subsequent CTA. Electronically Signed   By: Odessa Fleming M.D.   On: 10/02/2023 10:32    Microbiology Recent Results (from the past 240 hour(s))  MRSA Next Gen by PCR, Nasal     Status: None   Collection Time: 10/03/23  4:32 AM   Specimen: Nasal Mucosa; Nasal Swab  Result Value Ref Range Status   MRSA by PCR Next Gen NOT DETECTED NOT DETECTED Final    Comment: (NOTE) The GeneXpert MRSA Assay (FDA approved for NASAL specimens only), is one component of a comprehensive MRSA colonization surveillance program. It is not intended to diagnose MRSA infection nor to guide or monitor treatment for MRSA infections. Test performance is not FDA  approved in patients less than 19 years old. Performed at [facility], [address], [place], [place] [address]   Culture, blood (Routine X 2) w Reflex to ID Panel     Status: None   Collection Time: 10/05/23 10:48 PM   Specimen: BLOOD  Result Value Ref Range Status   Specimen Description BLOOD BLOOD RIGHT ARM  Final   Special Requests   Final    BOTTLES DRAWN AEROBIC AND ANAEROBIC Blood Culture adequate volume   Culture   Final    NO GROWTH 5 DAYS Performed at [facility], [address] [place], [place] [address]    Report Status 10/10/2023 FINAL  Final  Culture, blood (Routine X 2) w Reflex to ID Panel     Status: None   Collection Time: 10/05/23 10:59 PM   Specimen: BLOOD  Result Value Ref Range Status   Specimen Description BLOOD SITE NOT SPECIFIED  Final   Special Requests AEROBIC BOTTLE ONLY Blood Culture adequate volume  Final   Culture   Final    NO GROWTH 5 DAYS Performed at [facility], [address] [place], [place] [address]    Report Status 10/10/2023 FINAL  Final  SARS CORONAVIRUS 2 (TAT 6-24 HRS) Anterior Nasal Swab     Status: None   Collection Time: 10/05/23 11:23 PM   Specimen: Anterior Nasal Swab  Result Value Ref Range Status   SARS Coronavirus 2 NEGATIVE NEGATIVE Final    Comment: (NOTE) SARS-CoV-2 target nucleic acids are NOT DETECTED.  The SARS-CoV-2 RNA is generally detectable in upper and lower respiratory specimens during the acute phase of infection. Negative results do not preclude SARS-CoV-2 infection, do not rule out co-infections with other pathogens, and should not be used as the sole basis for treatment or other patient management decisions. Negative results must be combined with clinical observations, patient history, and epidemiological information. The expected result is Negative.  Fact Sheet for Patients: HairSlick.no  Fact Sheet for Healthcare  Providers: quierodirigir.com  This test is not yet approved or cleared by the [place] FDA and  has been authorized for detection and/or diagnosis of SARS-CoV-2 by FDA under an Emergency Use Authorization (EUA). This EUA will remain  in effect (meaning this test can be used) for the duration of the COVID-19 declaration under Se ction 564(b)(1) of the Act, 21 U.S.C. section 360bbb-3(b)(1), unless the authorization is terminated or revoked sooner.  Performed at [facility]  Lab, [address] [place], [place] [address]   Culture, BAL-quantitative w Gram Stain     Status: None   Collection Time: 10/06/23  4:19 PM   Specimen: Bronchoalveolar Lavage; Respiratory  Result Value Ref Range Status   Specimen Description BRONCHIAL ALVEOLAR LAVAGE  Final   Special Requests Normal  Final   Gram Stain   Final    WBC PRESENT,BOTH PMN AND MONONUCLEAR NO ORGANISMS SEEN CYTOSPIN SMEAR    Culture   Final    NO GROWTH 2 DAYS Performed at [facility], [address], [place], [place] [address]    Report Status 10/08/2023 FINAL  Final  Culture, blood (Routine X 2) w Reflex to ID Panel     Status: None (Preliminary result)   Collection Time: 09/22/2023  3:11 PM   Specimen: BLOOD  Result Value Ref Range Status   Specimen Description BLOOD SITE NOT SPECIFIED  Final   Special Requests   Final    BOTTLES DRAWN AEROBIC AND ANAEROBIC Blood Culture results may not be optimal due to an inadequate volume of blood received in culture bottles   Culture   Final    NO GROWTH 3 DAYS Performed at [facility], [address], [place], [place] [address]    Report Status PENDING  Incomplete  Culture, Urine (Do not remove urinary catheter, catheter placed by urology or difficult to place)     Status: None   Collection Time: 09/15/2023 10:08 PM   Specimen: Urine, Catheterized  Result Value Ref Range Status   Specimen Description URINE, CATHETERIZED  Final   Special  Requests Normal  Final   Culture   Final    NO GROWTH Performed at [facility], [address] [place], [place] [address]    Report Status 10/06/2023 FINAL  Final  SARS Coronavirus 2 by RT PCR (hospital order, performed in [facility] hospital lab) *cepheid single result test* Anterior Nasal Swab     Status: None   Collection Time: 10/05/2023 12:14 AM   Specimen: Anterior Nasal Swab  Result Value Ref Range Status   SARS Coronavirus 2 by RT PCR NEGATIVE NEGATIVE Final    Comment: Performed at [facility], [address], [place], [place] [address]    Lab Basic Metabolic Panel: Recent Labs  Lab 10/08/23 1034 10/08/23 1138 10/08/23 1700 10/08/23 2024 10/08/23 2239 10/08/23 2333 09/22/2023 0348 09/26/2023 0403 09/19/2023 0836 09/19/2023 1001 09/25/2023 1149  NA 153*   < > 153*   < > 149*   < > 155* 154* 157* 154* 158*  K 2.4*   < > 3.4*   < > 3.8   < > 3.8 3.9 3.5 3.5 3.6  CL 125*  --  123*  --  122*  --   --  126*  --  125*  --   CO2 22  --  20*  --  20*  --   --  21*  --  21*  --   GLUCOSE 342*  --  216*  --  246*  --   --  238*  --  263*  --   BUN 31*  --  32*  --  32*  --   --  32*  --  35*  --   CREATININE 1.86*  --  1.99*  --  1.86*  --   --  1.98*  --  2.05*  --   CALCIUM 8.0*  --  8.0*  --  7.8*  --   --  8.2*  --  8.3*  --   MG 2.1  --  2.2  --  2.2  --   --  2.2  --  2.4  --   PHOS 5.0*  --  4.9*  --  4.5  --   --  4.3  --  3.8  --    < > = values in this interval not displayed.   Liver Function Tests: Recent Labs  Lab 10/08/23 1034 10/08/23 1700 10/08/23 2239 10/01/2023 0403 09/13/2023 1001  AST 32 28 26 25 24   ALT 22 21 21 21 19   ALKPHOS 46 48 49 48 46  BILITOT 0.8 0.9 0.8 0.8 0.8  PROT 6.0* 5.9* 6.0* 6.1* 6.0*  ALBUMIN 2.4* 2.3* 2.3* 2.5* 2.4*   No results for input(s): "LIPASE", "AMYLASE" in the last 168 hours. No results for input(s): "AMMONIA" in the last 168 hours. CBC: Recent Labs  Lab 10/05/2023 1628 09/21/2023 2032 09/16/2023 2209 09/21/2023 2337  10/08/23 0402 10/08/23 0435 10/08/23 1034 10/08/23 1138 10/08/23 1700 10/08/23 2024 10/08/23 2333 10/05/2023 0348 10/03/2023 0836 09/27/2023 1149  WBC 9.3  --  12.1*  --  12.3*  --  13.1*  --  15.2*  --   --   --   --   --   HGB 11.3*   < > 11.5*   < > 10.9*   < > 10.5*   < > 10.4* 9.9* 9.9* 9.9* 10.2* 10.2*  HCT 36.3*   < > 35.9*   < > 34.3*   < > 32.8*   < > 32.4* 29.0* 29.0* 29.0* 30.0* 30.0*  MCV 97.3  --  98.4  --  98.6  --  97.9  --  96.4  --   --   --   --   --   PLT PLATELET CLUMPS NOTED ON SMEAR, UNABLE TO ESTIMATE  --  80*  --  75*  --  95*  --  92*  --   --   --   --   --    < > = values in this interval not displayed.   Cardiac Enzymes: Recent Labs  Lab 10/05/23 2322  CKTOTAL 464*  CKMB 8.5*   Sepsis Labs: Recent Labs  Lab 09/21/2023 0414 09/11/2023 1010 09/25/2023 1052 09/25/2023 1511 09/19/2023 1628 09/13/2023 2209 10/08/23 0402 10/08/23 1034 10/08/23 1700  WBC 11.0*  --    < >  --    < > 12.1* 12.3* 13.1* 15.2*  LATICACIDVEN 0.8 0.8  --  0.9  --  1.0  --   --   --    < > = values in this interval not displayed.    Procedures/Operations  As per EMR   Lesia Sago Nichele Slawson 10/10/2023, 5:43 PM

## 2023-10-11 NOTE — Anesthesia Postprocedure Evaluation (Signed)
Anesthesia Post Note  Patient: Mandrill Gotz  Procedure(s) Performed: ORGAN PROCUREMENT LIVER, KIDNEY, LUNGS     Patient location during evaluation: Other (Remains in OR) Anesthesia Type: General Post-procedure mental status: remains under GA until conclusion of anesthesia involvement. Respiratory status: Does not apply. Cardiovascular status: Does not apply. Anesthetic complications: no Comments: Patient remained intubated and under GA for the completion of anesthesia involvement. During this time period, vital signs remained stable. See intraoperative record for further details. Anesthesia involvement limited to early portion of the procurement.    No notable events documented.  Last Vitals:  Vitals:   09/21/2023 1500 10/10/2023 1600  BP: (!) 141/85 (!) 141/87  Pulse: (!) 59 (!) 58  Resp: (!) 22 (!) 21  Temp: (!) 36 C (!) 36 C  SpO2: 96% 97%    Last Pain:  Vitals:   09/25/2023 0400  TempSrc: Bladder                 Gonzales Nation

## 2023-10-11 NOTE — Transfer of Care (Signed)
Immediate Anesthesia Transfer of Care Note  Patient: Ryan Silva  Procedure(s) Performed: ORGAN PROCUREMENT LIVER, KIDNEY, LUNGS  Patient Location: Organ procurement in OR  Anesthesia Type:General  Level of Consciousness:   Airway & Oxygen Therapy:   Post-op Assessment:   Post vital signs:   Last Vitals:  Vitals Value Taken Time  BP    Temp    Pulse    Resp    SpO2      Last Pain:  Vitals:   10/06/2023 0400  TempSrc: Bladder         Complications: No notable events documented.

## 2023-10-11 DEATH — deceased

## 2023-10-12 LAB — CULTURE, BLOOD (ROUTINE X 2)
Culture: NO GROWTH
Culture: NO GROWTH

## 2023-10-14 LAB — SURGICAL PATHOLOGY
